# Patient Record
Sex: Male | Born: 1960 | Race: White | Hispanic: No | Marital: Single | Smoking: Former smoker
Health system: Southern US, Community
[De-identification: ages and names within clinical notes are randomized; demographics above are authoritative.]

## PROBLEM LIST (undated history)

## (undated) DIAGNOSIS — F329 Major depressive disorder, single episode, unspecified: Secondary | ICD-10-CM

## (undated) DIAGNOSIS — R51 Headache: Secondary | ICD-10-CM

## (undated) DIAGNOSIS — F419 Anxiety disorder, unspecified: Secondary | ICD-10-CM

## (undated) DIAGNOSIS — F4310 Post-traumatic stress disorder, unspecified: Secondary | ICD-10-CM

## (undated) DIAGNOSIS — F32A Depression, unspecified: Secondary | ICD-10-CM

## (undated) HISTORY — DX: Depression, unspecified: F32.A

## (undated) HISTORY — DX: Major depressive disorder, single episode, unspecified: F32.9

## (undated) HISTORY — PX: NECK SURGERY: SHX720

## (undated) HISTORY — PX: EYE SURGERY: SHX253

## (undated) HISTORY — DX: Anxiety disorder, unspecified: F41.9

## (undated) HISTORY — DX: Post-traumatic stress disorder, unspecified: F43.10

## (undated) HISTORY — DX: Headache: R51

---

## 2012-05-17 ENCOUNTER — Ambulatory Visit (INDEPENDENT_AMBULATORY_CARE_PROVIDER_SITE_OTHER): Payer: Managed Care, Other (non HMO) | Admitting: Psychiatry

## 2012-05-17 ENCOUNTER — Encounter (HOSPITAL_COMMUNITY): Payer: Self-pay | Admitting: Psychiatry

## 2012-05-17 VITALS — BP 150/90 | HR 65 | Wt 241.0 lb

## 2012-05-17 DIAGNOSIS — F3162 Bipolar disorder, current episode mixed, moderate: Secondary | ICD-10-CM

## 2012-05-17 DIAGNOSIS — F3160 Bipolar disorder, current episode mixed, unspecified: Secondary | ICD-10-CM

## 2012-05-17 DIAGNOSIS — H547 Unspecified visual loss: Secondary | ICD-10-CM | POA: Insufficient documentation

## 2012-05-17 DIAGNOSIS — Z9889 Other specified postprocedural states: Secondary | ICD-10-CM | POA: Insufficient documentation

## 2012-05-17 DIAGNOSIS — F4310 Post-traumatic stress disorder, unspecified: Secondary | ICD-10-CM
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
## 2012-06-05 ENCOUNTER — Encounter (HOSPITAL_COMMUNITY): Payer: Self-pay | Admitting: Licensed Clinical Social Worker

## 2012-06-05 ENCOUNTER — Ambulatory Visit (INDEPENDENT_AMBULATORY_CARE_PROVIDER_SITE_OTHER): Payer: Managed Care, Other (non HMO) | Admitting: Licensed Clinical Social Worker

## 2012-06-05 DIAGNOSIS — F3162 Bipolar disorder, current episode mixed, moderate: Secondary | ICD-10-CM | POA: Insufficient documentation

## 2012-06-05 DIAGNOSIS — F4310 Post-traumatic stress disorder, unspecified: Secondary | ICD-10-CM
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
## 2012-06-07 ENCOUNTER — Ambulatory Visit (INDEPENDENT_AMBULATORY_CARE_PROVIDER_SITE_OTHER): Payer: Managed Care, Other (non HMO) | Admitting: Psychiatry

## 2012-06-07 ENCOUNTER — Encounter (HOSPITAL_COMMUNITY): Payer: Self-pay | Admitting: Psychiatry

## 2012-06-07 VITALS — BP 142/93 | HR 78 | Ht 74.0 in | Wt 240.0 lb

## 2012-06-07 DIAGNOSIS — F3162 Bipolar disorder, current episode mixed, moderate: Secondary | ICD-10-CM

## 2012-06-07 DIAGNOSIS — F3160 Bipolar disorder, current episode mixed, unspecified: Secondary | ICD-10-CM

## 2012-06-07 DIAGNOSIS — F4310 Post-traumatic stress disorder, unspecified: Secondary | ICD-10-CM

## 2012-06-07 MED ORDER — ESCITALOPRAM OXALATE 20 MG PO TABS: 20.0000 mg | ORAL_TABLET | Freq: Every day | ORAL | Status: DC

## 2012-06-07 MED ORDER — BUPROPION HCL ER (XL) 300 MG PO TB24: 300.0000 mg | ORAL_TABLET | ORAL | Status: DC

## 2012-06-07 MED ORDER — TEMAZEPAM 15 MG PO CAPS: 15.0000 mg | ORAL_CAPSULE | Freq: Every evening | ORAL | Status: DC | PRN
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
## 2012-06-12 ENCOUNTER — Ambulatory Visit (INDEPENDENT_AMBULATORY_CARE_PROVIDER_SITE_OTHER): Payer: Managed Care, Other (non HMO) | Admitting: Licensed Clinical Social Worker

## 2012-06-12 DIAGNOSIS — F3162 Bipolar disorder, current episode mixed, moderate: Secondary | ICD-10-CM

## 2012-06-12 DIAGNOSIS — F4310 Post-traumatic stress disorder, unspecified: Secondary | ICD-10-CM
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
## 2012-06-19 ENCOUNTER — Ambulatory Visit (INDEPENDENT_AMBULATORY_CARE_PROVIDER_SITE_OTHER): Payer: Managed Care, Other (non HMO) | Admitting: Licensed Clinical Social Worker

## 2012-06-19 DIAGNOSIS — F3162 Bipolar disorder, current episode mixed, moderate: Secondary | ICD-10-CM

## 2012-06-19 DIAGNOSIS — F4310 Post-traumatic stress disorder, unspecified: Secondary | ICD-10-CM
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
## 2012-07-04 ENCOUNTER — Ambulatory Visit (HOSPITAL_COMMUNITY): Payer: Managed Care, Other (non HMO) | Admitting: Psychiatry

## 2012-07-10 ENCOUNTER — Other Ambulatory Visit (HOSPITAL_COMMUNITY): Payer: Self-pay | Admitting: *Deleted

## 2012-07-10 ENCOUNTER — Ambulatory Visit (INDEPENDENT_AMBULATORY_CARE_PROVIDER_SITE_OTHER): Payer: Managed Care, Other (non HMO) | Admitting: Licensed Clinical Social Worker

## 2012-07-10 DIAGNOSIS — F4310 Post-traumatic stress disorder, unspecified: Secondary | ICD-10-CM

## 2012-07-10 DIAGNOSIS — F3162 Bipolar disorder, current episode mixed, moderate: Secondary | ICD-10-CM

## 2012-07-10 MED ORDER — BUPROPION HCL ER (XL) 300 MG PO TB24: 300.0000 mg | ORAL_TABLET | ORAL | Status: DC
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
## 2012-07-10 NOTE — Telephone Encounter (Signed)
RX for Wellbutrin printed instead of being sent electronically. Resent at this time

## 2012-07-12 ENCOUNTER — Ambulatory Visit (HOSPITAL_COMMUNITY): Payer: Managed Care, Other (non HMO) | Admitting: Psychiatry

## 2012-07-24 ENCOUNTER — Ambulatory Visit (INDEPENDENT_AMBULATORY_CARE_PROVIDER_SITE_OTHER): Payer: Managed Care, Other (non HMO) | Admitting: Licensed Clinical Social Worker

## 2012-07-24 DIAGNOSIS — F3162 Bipolar disorder, current episode mixed, moderate: Secondary | ICD-10-CM

## 2012-07-24 DIAGNOSIS — F4310 Post-traumatic stress disorder, unspecified: Secondary | ICD-10-CM
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
## 2012-07-31 ENCOUNTER — Ambulatory Visit (INDEPENDENT_AMBULATORY_CARE_PROVIDER_SITE_OTHER): Payer: Managed Care, Other (non HMO) | Admitting: Licensed Clinical Social Worker

## 2012-07-31 DIAGNOSIS — F4310 Post-traumatic stress disorder, unspecified: Secondary | ICD-10-CM

## 2012-07-31 DIAGNOSIS — F3162 Bipolar disorder, current episode mixed, moderate: Secondary | ICD-10-CM

## 2012-07-31 NOTE — Progress Notes (Signed)
   THERAPIST PROGRESS NOTE  Session Time: 11:30-12:20pm  Participation Level: Active  Behavioral Response: Well GroomedAlertAnxious  Type of Therapy: Individual Therapy  Treatment Goals addressed: Coping  Interventions: CBT, Strength-based, Supportive and Reframing  Summary: Jim Owens is a 52 y.o. male who presents with euthymic mood and anxious affect. He reports doing well, but did feel triggered yesterday after deciding to return his Jeep because he can no longer afford it. He processes his feelings of grief and the time it has taken to reconcile and accept that his life has taken on a different plan than expected. He wants to be financially responsible and feels good about his decision. He writes in his journal often and finds this very therapeutic. His sleep and appetite are wnl.   Suicidal/Homicidal: Nowithout intent/plan  Therapist Response: Assessed patients current functioning and reviewed progress. Reviewed coping strategies. Assessed patients safety and assisted in identifying protective factors.  Reviewed crisis plan with patient. Assisted patient with the expression of anxiety. Reviewed patients self care plan. Assessed progress related to self care. Patients self care is good. Recommend daily exercise, increased socialization and recreation. Used CBT to assist patient with the identification of negative distortions and irrational thoughts. Encouraged patient to verbalize alternative and factual responses which challenge thought distortions. Used DBT to practice mindfulness, review distraction list and improve distress tolerance skills. Processed and normalized patients grief reaction.   Plan: Return again in two weeks.  Diagnosis: Axis I: Post Traumatic Stress Disorder and bi polar    Axis II: No diagnosis    Lindwood Mogel, LCSW 07/31/2012

## 2012-08-06 ENCOUNTER — Other Ambulatory Visit (HOSPITAL_COMMUNITY): Payer: Self-pay | Admitting: *Deleted

## 2012-08-06 DIAGNOSIS — F3162 Bipolar disorder, current episode mixed, moderate: Secondary | ICD-10-CM

## 2012-08-06 DIAGNOSIS — F4310 Post-traumatic stress disorder, unspecified: Secondary | ICD-10-CM

## 2012-08-06 NOTE — Telephone Encounter (Signed)
Pt changing to different provider in clinic.Appt with Jorje Guild, PA on 7/24.

## 2012-08-07 ENCOUNTER — Ambulatory Visit (INDEPENDENT_AMBULATORY_CARE_PROVIDER_SITE_OTHER): Payer: Managed Care, Other (non HMO) | Admitting: Licensed Clinical Social Worker

## 2012-08-07 DIAGNOSIS — F4310 Post-traumatic stress disorder, unspecified: Secondary | ICD-10-CM

## 2012-08-07 DIAGNOSIS — F3162 Bipolar disorder, current episode mixed, moderate: Secondary | ICD-10-CM

## 2012-08-07 MED ORDER — LITHIUM CARBONATE 300 MG PO CAPS
300.0000 mg | ORAL_CAPSULE | Freq: Two times a day (BID) | ORAL | Status: DC
Start: 2012-08-06 — End: 2012-10-04

## 2012-08-07 MED ORDER — TEMAZEPAM 15 MG PO CAPS: 15.0000 mg | ORAL_CAPSULE | Freq: Every evening | ORAL | Status: DC | PRN

## 2012-08-07 NOTE — Telephone Encounter (Signed)
Refills given as prescribed by Dr. Lolly Mustache 06/07/12 as he is current provider until pt sees Jorje Guild, [place]

## 2012-08-07 NOTE — Progress Notes (Signed)
   THERAPIST PROGRESS NOTE  Session Time: 11:30am-12:20pm  Participation Level: Active  Behavioral Response: Well GroomedAlertAnxious  Type of Therapy: Individual Therapy  Treatment Goals addressed: Coping  Interventions: CBT, DBT, Strength-based, Supportive and Reframing  Summary: Jim Owens is a 53 y.o. male who presents with anxious mood and bright affect. He reports having a good week, without any triggering events related to his trauma. He reports his realization that he has difficulty interpreting the emotions of others. He experiences a disconnect and is frustrated by this. When he looks at a picture of a child who is smiling, he does not automatically realize that the child is happy. He explores his PTSD symptoms and anger towards middle easterners. He is ashamed he feels this way and needs reassurance. He remains active and social. His sleep and appetite are wnl.    Suicidal/Homicidal: Nowithout intent/plan  Therapist Response: Assessed patients current functioning and reviewed progress. Reviewed coping strategies. Assessed patients safety and assisted in identifying protective factors.  Reviewed crisis plan with patient. Assisted patient with the expression of anxiety. Reviewed patients self care plan. Assessed progress related to self care. Patients self care is good. Recommend daily exercise, increased socialization and recreation. Used CBT to assist patient with the identification of negative distortions and irrational thoughts. Encouraged patient to verbalize alternative and factual responses which challenge thought distortions. Used DBT to practice mindfulness, review distraction list and improve distress tolerance skills.   Plan: Return again in one weeks.  Diagnosis: Axis I: Post Traumatic Stress Disorder and bi polar     Axis II: No diagnosis    Maynard David, LCSW 08/07/2012

## 2012-08-09 ENCOUNTER — Ambulatory Visit (HOSPITAL_COMMUNITY): Payer: Managed Care, Other (non HMO) | Admitting: Psychiatry

## 2012-08-09 ENCOUNTER — Ambulatory Visit (INDEPENDENT_AMBULATORY_CARE_PROVIDER_SITE_OTHER): Payer: Managed Care, Other (non HMO) | Admitting: Physician Assistant

## 2012-08-09 ENCOUNTER — Encounter (HOSPITAL_COMMUNITY): Payer: Self-pay | Admitting: Physician Assistant

## 2012-08-09 VITALS — BP 129/90 | HR 64 | Ht 74.0 in | Wt 242.2 lb

## 2012-08-09 DIAGNOSIS — F3162 Bipolar disorder, current episode mixed, moderate: Secondary | ICD-10-CM

## 2012-08-09 DIAGNOSIS — F4310 Post-traumatic stress disorder, unspecified: Secondary | ICD-10-CM

## 2012-08-09 DIAGNOSIS — F319 Bipolar disorder, unspecified: Secondary | ICD-10-CM

## 2012-08-09 MED ORDER — ESCITALOPRAM OXALATE 20 MG PO TABS: 20.0000 mg | ORAL_TABLET | Freq: Every day | ORAL | Status: DC

## 2012-08-09 MED ORDER — BUPROPION HCL ER (XL) 300 MG PO TB24: 300.0000 mg | ORAL_TABLET | ORAL | Status: DC

## 2012-08-14 ENCOUNTER — Other Ambulatory Visit (HOSPITAL_COMMUNITY): Payer: Self-pay | Admitting: Physician Assistant

## 2012-08-14 ENCOUNTER — Ambulatory Visit (INDEPENDENT_AMBULATORY_CARE_PROVIDER_SITE_OTHER): Payer: Managed Care, Other (non HMO) | Admitting: Licensed Clinical Social Worker

## 2012-08-14 DIAGNOSIS — F3162 Bipolar disorder, current episode mixed, moderate: Secondary | ICD-10-CM

## 2012-08-14 DIAGNOSIS — F4310 Post-traumatic stress disorder, unspecified: Secondary | ICD-10-CM

## 2012-08-14 MED ORDER — ESCITALOPRAM OXALATE 20 MG PO TABS: 20.0000 mg | ORAL_TABLET | Freq: Every day | ORAL | Status: DC

## 2012-08-14 NOTE — Progress Notes (Signed)
   THERAPIST PROGRESS NOTE  Session Time: 11:30am-12:20pm  Participation Level: Active  Behavioral Response: Well GroomedAlertAnxious and Euthymic  Type of Therapy: Individual Therapy  Treatment Goals addressed: Coping  Interventions: CBT, DBT, Strength-based, Supportive and Reframing  Summary: Jim Owens is a 52 y.o. male who presents with euthymic mood and anxious affect. He reports feeling tired because he is not sleeping well due to nightmares. He describes his dreams and his confusion over them. He feels numb and disconnected from his feelings. He believes that he has experienced too much trauma in a short period of time and that he has shut off his ability to feel. He is fearful of exploring his trauma in emotional detail. He reports that he would have to be in the hospital to do this and even then, is uncertain if he would make it out. He is able to discuss facts but not feelings. He processes his grief over the loss of his job and his desire to spend time with children again and help them. His sleep and appetite are wnl.    Suicidal/Homicidal: Nowithout intent/plan  Therapist Response: Assessed patients current functioning and reviewed progress. Reviewed coping strategies. Assessed patients safety and assisted in identifying protective factors.  Reviewed crisis plan with patient. Assisted patient with the expression of anxiety. Reviewed patients self care plan. Assessed progress related to self care. Patients self care is excellenlt. Recommend daily exercise, increased socialization and recreation. Used DBT to practice mindfulness, review distraction list and improve distress tolerance skills. Used CBT to assist patient with the identification of negative distortions and irrational thoughts. Encouraged patient to verbalize alternative and factual responses which challenge thought distortions. Processed and normalized patients grief reaction.   Plan: Return again in one  weeks.  Diagnosis: Axis I: Post Traumatic Stress Disorder and bi polar    Axis II: No diagnosis    Marcena Dias, LCSW 08/14/2012
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
## 2012-08-20 ENCOUNTER — Ambulatory Visit (INDEPENDENT_AMBULATORY_CARE_PROVIDER_SITE_OTHER): Payer: Managed Care, Other (non HMO) | Admitting: Licensed Clinical Social Worker

## 2012-08-20 DIAGNOSIS — F4310 Post-traumatic stress disorder, unspecified: Secondary | ICD-10-CM

## 2012-08-20 DIAGNOSIS — F3162 Bipolar disorder, current episode mixed, moderate: Secondary | ICD-10-CM

## 2012-08-20 NOTE — Progress Notes (Signed)
   THERAPIST PROGRESS NOTE  Session Time: 10:30am-11:20am  Participation Level: Active  Behavioral Response: Well GroomedAlertEuthymic  Type of Therapy: Individual Therapy  Treatment Goals addressed: Coping  Interventions: CBT, DBT, Strength-based, Supportive and Reframing  Summary: Jim Owens is a 52 y.o. male who presents with euthymic mood and bright affect. He reports feeling well and that he continues to focus on identifying his emotions. Today is the first time he has realized that he is "hurt" over loosing his job. He is able to identify easily with the feelings of anger, but being hurt and betrayed are difficult for him. He continues to process his grief and disbelief over how he left his job. He processes his concern over his black out and his fear that if he begins to do trauma work, that he will uncover something he is not prepared for. His sleep and appetite are wnl.    Suicidal/Homicidal: Nowithout intent/plan  Therapist Response: Assessed patients current functioning and reviewed progress. Reviewed coping strategies. Assessed patients safety and assisted in identifying protective factors.  Reviewed crisis plan with patient. Assisted patient with the expression of frustration. Reviewed patients self care plan. Assessed progress related to self care. Patients self care is goode. Recommend daily exercise, increased socialization and recreation. Used CBT to assist patient with the identification of negative distortions and irrational thoughts. Encouraged patient to verbalize alternative and factual responses which challenge thought distortions. Used DBT to practice mindfulness, review distraction list and improve distress tolerance skills. Processed and normalized patients grief reaction.   Plan: Return again in one weeks.  Diagnosis: Axis I: Post Traumatic Stress Disorder and bi polar    Axis II: No diagnosis    Magen Suriano, LCSW 08/20/2012

## 2012-08-30 ENCOUNTER — Other Ambulatory Visit (HOSPITAL_COMMUNITY): Payer: Self-pay | Admitting: *Deleted

## 2012-08-30 DIAGNOSIS — F4310 Post-traumatic stress disorder, unspecified: Secondary | ICD-10-CM

## 2012-08-30 DIAGNOSIS — F3162 Bipolar disorder, current episode mixed, moderate: Secondary | ICD-10-CM

## 2012-08-30 NOTE — Telephone Encounter (Signed)
Pharmacy changed in [facility]

## 2012-08-31 ENCOUNTER — Telehealth (HOSPITAL_COMMUNITY): Payer: Self-pay

## 2012-08-31 MED ORDER — TEMAZEPAM 15 MG PO CAPS: 15.0000 mg | ORAL_CAPSULE | Freq: Every evening | ORAL | Status: DC | PRN

## 2012-08-31 NOTE — Telephone Encounter (Signed)
08/31/12 10:16AM  Patient called requesting to speak with Sandi (nurse) patient stated that the pharmacy they were speaking about is correct [facility]'s [facility]..Jim Owens

## 2012-09-05 ENCOUNTER — Ambulatory Visit (INDEPENDENT_AMBULATORY_CARE_PROVIDER_SITE_OTHER): Payer: Managed Care, Other (non HMO) | Admitting: Licensed Clinical Social Worker

## 2012-09-05 DIAGNOSIS — F4310 Post-traumatic stress disorder, unspecified: Secondary | ICD-10-CM

## 2012-09-05 DIAGNOSIS — F3162 Bipolar disorder, current episode mixed, moderate: Secondary | ICD-10-CM

## 2012-09-05 NOTE — Progress Notes (Signed)
   THERAPIST PROGRESS NOTE  Session Time: 9:30am-10:20am  Participation Level: Active  Behavioral Response: Well GroomedAlertAnxious  Type of Therapy: Individual Therapy  Treatment Goals addressed: Coping  Interventions: CBT, DBT, Strength-based, Supportive and Reframing  Summary: Jim Owens is a 52 y.o. male who presents with euthymic mood and anxious affect. He has brought his journal with him today and wants to discuss patterns he notices around decision making, anxiety related to purchasing items and self doubt related to how his job ended. 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 He may look into pursuing this in a more formal way in the future. His anxiety remains high, but he is able to use CBT and DBT to help reframe and redirect his thinking when needed. His sleep and appetite are wnl.   Suicidal/Homicidal: Nowithout intent/plan  Therapist Response: Assessed patients current functioning and reviewed progress. Reviewed coping strategies. Assessed patients safety and assisted in identifying protective factors.  Reviewed crisis plan with patient. Assisted patient with the expression of anxeity. Reviewed patients self care plan. Assessed progress related to self care. Patients self care is good. Recommend daily exercise, increased socialization and recreation. Used CBT to assist patient with the identification of negative distortions and irrational thoughts. Encouraged patient to verbalize alternative and factual responses which challenge thought distortions. Used DBT to practice mindfulness, review distraction list and improve distress tolerance skills. Processed and normalized patients grief reaction.   Plan: Return again in two weeks.  Diagnosis: Axis I: Post  Traumatic Stress Disorder and bi polar    Axis II: No diagnosis    Katheren Jimmerson, LCSW 09/05/2012

## 2012-09-06 ENCOUNTER — Other Ambulatory Visit (HOSPITAL_COMMUNITY): Payer: Self-pay | Admitting: *Deleted

## 2012-09-06 DIAGNOSIS — F3162 Bipolar disorder, current episode mixed, moderate: Secondary | ICD-10-CM

## 2012-09-06 DIAGNOSIS — F4310 Post-traumatic stress disorder, unspecified: Secondary | ICD-10-CM

## 2012-09-06 MED ORDER — TEMAZEPAM 15 MG PO CAPS: 15.0000 mg | ORAL_CAPSULE | Freq: Every evening | ORAL | Status: DC | PRN

## 2012-09-06 NOTE — Telephone Encounter (Signed)
Med not received by [facility] on 08/30/12. Called to [place] at [facility] at this time

## 2012-09-10 ENCOUNTER — Ambulatory Visit (HOSPITAL_COMMUNITY): Payer: Managed Care, Other (non HMO) | Admitting: Licensed Clinical Social Worker

## 2012-09-24 ENCOUNTER — Ambulatory Visit (INDEPENDENT_AMBULATORY_CARE_PROVIDER_SITE_OTHER): Payer: Managed Care, Other (non HMO) | Admitting: Licensed Clinical Social Worker

## 2012-09-24 DIAGNOSIS — F4310 Post-traumatic stress disorder, unspecified: Secondary | ICD-10-CM

## 2012-09-24 DIAGNOSIS — F3162 Bipolar disorder, current episode mixed, moderate: Secondary | ICD-10-CM
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
## 2012-09-25 ENCOUNTER — Ambulatory Visit (INDEPENDENT_AMBULATORY_CARE_PROVIDER_SITE_OTHER): Payer: Managed Care, Other (non HMO) | Admitting: Physician Assistant

## 2012-09-25 ENCOUNTER — Encounter (HOSPITAL_COMMUNITY): Payer: Self-pay | Admitting: Physician Assistant

## 2012-09-25 VITALS — BP 137/94 | HR 69 | Ht 74.0 in | Wt 240.4 lb

## 2012-09-25 DIAGNOSIS — F319 Bipolar disorder, unspecified: Secondary | ICD-10-CM

## 2012-09-25 DIAGNOSIS — F4310 Post-traumatic stress disorder, unspecified: Secondary | ICD-10-CM

## 2012-09-25 DIAGNOSIS — F3162 Bipolar disorder, current episode mixed, moderate: Secondary | ICD-10-CM

## 2012-09-25 MED ORDER — SERTRALINE HCL 100 MG PO TABS: ORAL_TABLET | ORAL | Status: DC

## 2012-09-25 NOTE — Progress Notes (Signed)
[facility] [id number] Progress Note  Jim Owens [id number] 52 y.o.  09/25/2012 1:45 PM  Chief Complaint: Anxiety  History of Present Illness: [name] presents today to followup on his treatment for PTSD and bipolar. He reports that his mood has been fairly stable, although he describes some mild lability. He expresses that he is been having a significant amount of anxiety in that he worries excessively and occasionally feels panicky. He also finds himself obsessing over finding the best price for items while shopping online. He endorses some suicidal thoughts that her passive in nature, but denies any plan or intent. He denies any auditory or visual hallucinations or homicidal ideation. He is come to realize that he is a valuable person, and does not need validation from others. He feels that the work he is doing in therapy has been very helpful.  Suicidal Ideation: Yes Plan Formed: No Patient has means to carry out plan: No  Homicidal Ideation: No Plan Formed: No Patient has means to carry out plan: No  Review of Systems: Psychiatric: Agitation: No Hallucination: No Depressed Mood: No Insomnia: No Hypersomnia: No Altered Concentration: No Feels Worthless: No Grandiose Ideas: No Belief In Special Powers: No New/Increased Substance Abuse: No Compulsions: No  Neurologic: Headache: No Seizure: No Paresthesias: No  Medical history.  Patient has history of neck surgery and multiple eye surgery. He still has been impairment on his right eye. He was diagnosed with retinal detachment. Patient had a history of seizures, blackouts, traumatic brain injury or any loss of consciousness.   Psychosocial history.  Patient was born and raised in [place]. He lived in [place] and [place] area for 23 years. He is a Chartered loss adjuster and recently retired. He moved to [place] upon his retirement. He likes the area. He has good friends who lives in [place]. Patient's mother  sister and brother lives in [place]. He has a step brother and sister who lives in [place]. Patient never married. He has no children.   Family history.  Patient endorsed grandfather and uncle has bipolar disorder. Mother has depression and alcoholism.    Outpatient Encounter Prescriptions as of 09/25/2012  Medication Sig Dispense Refill  . buPROPion (WELLBUTRIN XL) 300 MG 24 hr tablet Take 1 tablet (300 mg total) by mouth every morning.  30 tablet  2  . lithium carbonate 300 MG capsule Take 1 capsule (300 mg total) by mouth 2 (two) times daily with a meal.  60 capsule  0  . nepafenac (NEVANAC) 0.1 % ophthalmic suspension 3 drops 3 (three) times daily.      . sertraline (ZOLOFT) 100 MG tablet Take 1/2 tablet daily for 6 days, then take one daily  30 tablet  1  . temazepam (RESTORIL) 15 MG capsule Take 1 capsule (15 mg total) by mouth at bedtime as needed for sleep.  30 capsule  0  . [DISCONTINUED] escitalopram (LEXAPRO) 20 MG tablet Take 1 tablet (20 mg total) by mouth daily.  30 tablet  2   No facility-administered encounter medications on file as of 09/25/2012.    Past Psychiatric History/Hospitalization(s): Anxiety: Yes Bipolar Disorder: Yes Depression: Yes Mania: Yes Psychosis: No Schizophrenia: No Personality Disorder: No Hospitalization for psychiatric illness: Yes History of Electroconvulsive Shock Therapy: No Prior Suicide Attempts: No  Physical Exam: Constitutional:  BP 137/94  Pulse 69  Ht 6\' 2"  (1.88 m)  Wt 240 lb 6.4 oz (109.045 kg)  BMI 30.85 kg/m2  General Appearance: alert, oriented, no acute distress, well nourished and  well groomed and casually dressed  Musculoskeletal: Strength & Muscle Tone: within normal limits Gait & Station: normal Patient leans: N/A  Psychiatric: Speech (describe rate, volume, coherence, spontaneity, and abnormalities if any): Clear and coherent at a regular rate and rhythm  Thought Process (describe rate, content, abstract  reasoning, and computation): Within normal limits  Associations: Intact  Thoughts: obsessions  Mental Status: Orientation: oriented to person, place, time/date and situation Mood & Affect: anxiety Attention Span & Concentration: Intact  Medical Decision Making (Choose Three): Established Problem, Stable/Improving (1), Review of Psycho-Social Stressors (1) and Review of New Medication or Change in Dosage (2) PTSD, but Assessment: Axis I: PTSD, bipolar disorder  Axis II: Deferred  Axis III: Noncontributory  Axis IV: Moderate  Axis V: 60   Plan: We will cross taper his Lexapro to Zoloft 100 mg daily, and continue his Wellbutrin XL 300 mg daily, lithium carbonate 300 mg twice daily with meals, and temazepam 15 mg at bedtime. He will return for followup at my next available appointment, hopefully in 4 weeks.  Demitrius Crass, PA-C 09/25/2012

## 2012-10-03 ENCOUNTER — Other Ambulatory Visit (HOSPITAL_COMMUNITY): Payer: Self-pay | Admitting: Psychiatry

## 2012-10-04 ENCOUNTER — Other Ambulatory Visit (HOSPITAL_COMMUNITY): Payer: Self-pay | Admitting: Psychiatry

## 2012-10-04 ENCOUNTER — Other Ambulatory Visit (HOSPITAL_COMMUNITY): Payer: Self-pay | Admitting: Physician Assistant

## 2012-10-04 DIAGNOSIS — F3162 Bipolar disorder, current episode mixed, moderate: Secondary | ICD-10-CM

## 2012-10-04 DIAGNOSIS — F4310 Post-traumatic stress disorder, unspecified: Secondary | ICD-10-CM

## 2012-10-08 ENCOUNTER — Ambulatory Visit (INDEPENDENT_AMBULATORY_CARE_PROVIDER_SITE_OTHER): Payer: Managed Care, Other (non HMO) | Admitting: Licensed Clinical Social Worker

## 2012-10-08 DIAGNOSIS — F4310 Post-traumatic stress disorder, unspecified: Secondary | ICD-10-CM

## 2012-10-08 DIAGNOSIS — F3162 Bipolar disorder, current episode mixed, moderate: Secondary | ICD-10-CM

## 2012-10-08 NOTE — Progress Notes (Signed)
   THERAPIST PROGRESS NOTE  Session Time: 10:30am-11:20am  Participation Level: Active  Behavioral Response: Well GroomedAlertAnxious and Euthymic  Type of Therapy: Individual Therapy  Treatment Goals addressed: Coping  Interventions: CBT, Strength-based, Supportive and Reframing  Summary: Jim Owens is a 52 y.o. male who presents with euthymic mood and anxious affect. He reports ongoing improvement in his degree of anxiety and frustration. He has finally moved into his home. He is happy there, enjoys the peace and solitude. He needed to have some space for himself and to be alone. He continues to write to express his feelings and processes his anger over how much he has been betrayed by those who he worked for and with. He processes his grief over this. He is learning his early warning signs for PTSD triggers. His sleep and appetite are wnl.   Suicidal/Homicidal: Nowithout intent/plan  Therapist Response: Assessed patients current functioning and reviewed progress. Reviewed coping strategies. Assessed patients safety and assisted in identifying protective factors.  Reviewed crisis plan with patient. Assisted patient with the expression of frustration. Reviewed patients self care plan. Assessed progress related to self care. Patients self care is good. Recommend daily exercise, increased socialization and recreation. Used CBT to assist patient with the identification of negative distortions and irrational thoughts. Encouraged patient to verbalize alternative and factual responses which challenge thought distortions. Processed and normalized patients grief reaction.   Plan: Return again in two weeks.  Diagnosis: Axis I: Bipolar, mixed and Post Traumatic Stress Disorder    Axis II: No diagnosis    Rosselyn Martha, LCSW 10/08/2012

## 2012-10-22 ENCOUNTER — Ambulatory Visit (INDEPENDENT_AMBULATORY_CARE_PROVIDER_SITE_OTHER): Payer: Managed Care, Other (non HMO) | Admitting: Licensed Clinical Social Worker

## 2012-10-22 DIAGNOSIS — F4310 Post-traumatic stress disorder, unspecified: Secondary | ICD-10-CM

## 2012-10-22 DIAGNOSIS — F3162 Bipolar disorder, current episode mixed, moderate: Secondary | ICD-10-CM
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
## 2012-10-30 ENCOUNTER — Other Ambulatory Visit (HOSPITAL_COMMUNITY): Payer: Self-pay | Admitting: Physician Assistant

## 2012-11-05 ENCOUNTER — Ambulatory Visit (INDEPENDENT_AMBULATORY_CARE_PROVIDER_SITE_OTHER): Payer: Managed Care, Other (non HMO) | Admitting: Physician Assistant

## 2012-11-05 ENCOUNTER — Encounter (INDEPENDENT_AMBULATORY_CARE_PROVIDER_SITE_OTHER): Payer: Self-pay

## 2012-11-05 VITALS — BP 134/85 | HR 78 | Ht 74.0 in | Wt 233.0 lb

## 2012-11-05 DIAGNOSIS — F4310 Post-traumatic stress disorder, unspecified: Secondary | ICD-10-CM

## 2012-11-05 DIAGNOSIS — F319 Bipolar disorder, unspecified: Secondary | ICD-10-CM

## 2012-11-05 DIAGNOSIS — F3162 Bipolar disorder, current episode mixed, moderate: Secondary | ICD-10-CM

## 2012-11-07 ENCOUNTER — Ambulatory Visit (INDEPENDENT_AMBULATORY_CARE_PROVIDER_SITE_OTHER): Payer: Managed Care, Other (non HMO) | Admitting: Licensed Clinical Social Worker

## 2012-11-07 DIAGNOSIS — F3162 Bipolar disorder, current episode mixed, moderate: Secondary | ICD-10-CM

## 2012-11-07 DIAGNOSIS — F4310 Post-traumatic stress disorder, unspecified: Secondary | ICD-10-CM

## 2012-11-07 NOTE — Progress Notes (Signed)
   THERAPIST PROGRESS NOTE  Session Time: 10:30am-11:20am  Participation Level: Active  Behavioral Response: Well GroomedAlertEuthymic  Type of Therapy: Individual Therapy  Treatment Goals addressed: Coping  Interventions: CBT, Strength-based, Supportive and Reframing  Summary: Jim Owens is a 52 y.o. male who presents with euthymic mood and bright affect. He reports that he is doing well with well controlled mood stability, well controlled PTSD and anxiety. He continues to write as his primary tool for expression of his feelings. He reports that he has been learning more about himself and has come to the realization that the people whom he used to work for didn't care about him and he believes that they would not have cared if he killed himself. He struggles with a lack of "feeling" and understands that this is related to his trauma. He also struggles with hyper vigilance and assumptions of dread. His sleep and appetite are wnl.   Suicidal/Homicidal: Nowithout intent/plan  Therapist Response: Assessed patients current functioning and reviewed progress. Reviewed coping strategies. Assessed patients safety and assisted in identifying protective factors.  Reviewed crisis plan with patient. Assisted patient with the expression of anxiety related to his trauma history. Reviewed patients self care plan. Assessed progress related to self care. Patients self care is excellent . Recommend daily exercise, increased socialization and recreation. Used CBT to assist patient with the identification of negative distortions and irrational thoughts. Encouraged patient to verbalize alternative and factual responses which challenge thought distortions. Used DBT to practice mindfulness, review distraction list and improve distress tolerance skills.   Plan: Return again in two weeks.  Diagnosis: Axis I: Post Traumatic Stress Disorder and bi polar    Axis II: No diagnosis    Teea Ducey,  LCSW 11/07/2012

## 2012-11-09 ENCOUNTER — Encounter (HOSPITAL_COMMUNITY): Payer: Self-pay | Admitting: Physician Assistant

## 2012-11-09 NOTE — Progress Notes (Signed)
   [facility] Follow-up Outpatient Visit  Jim Owens Oct 29, 1960  Date: 11/05/2012   Subjective: [name] presents today to followup on his treatment for PTSD and bipolar disorder. At his last visit we cross taper his Celexa to Zoloft because he was experiencing an increase in anxiety. He reports that the cross taper went well. He is feeling less obsessive and experiencing less anxiety. His mood is "pretty good." He reports that he is sleeping well, and his appetite is good. He denies any suicidal or homicidal ideation. He denies any auditory or visual hallucinations. He has recently discovered a new hobby and has begun practicing with a cross bow. He is also enjoying being outdoors more.  Filed Vitals:   11/05/12 1608  BP: 134/85  Pulse: 78    Mental Status Examination  Appearance: Well groomed and casually dressed Alert: Yes Attention: good  Cooperative: Yes Eye Contact: Good Speech: Clear and coherent Psychomotor Activity: Normal Memory/Concentration: Intact Oriented: person, place, time/date and situation Mood: Euthymic Affect: Congruent Thought Processes and Associations: Linear Fund of Knowledge: Good Thought Content: Normal Insight: Good Judgement: Good  Diagnosis: PTSD, bipolar 1  Treatment Plan: We will continue his Zoloft at 100 mg daily, Wellbutrin XL 300 mg daily, lithium carbonate 300 mg twice daily with meals, and Restoril 15 mg at bedtime. He will return for a followup visit in 2-3 months with Dr. Donell Beers.  Charene Mccallister, PA-C

## 2012-11-17 ENCOUNTER — Other Ambulatory Visit (HOSPITAL_COMMUNITY): Payer: Self-pay | Admitting: Physician Assistant

## 2012-11-17 DIAGNOSIS — F4310 Post-traumatic stress disorder, unspecified: Secondary | ICD-10-CM

## 2012-11-17 DIAGNOSIS — F3162 Bipolar disorder, current episode mixed, moderate: Secondary | ICD-10-CM

## 2012-11-19 ENCOUNTER — Ambulatory Visit (INDEPENDENT_AMBULATORY_CARE_PROVIDER_SITE_OTHER): Payer: Managed Care, Other (non HMO) | Admitting: Licensed Clinical Social Worker

## 2012-11-19 DIAGNOSIS — F3162 Bipolar disorder, current episode mixed, moderate: Secondary | ICD-10-CM

## 2012-11-19 DIAGNOSIS — F4310 Post-traumatic stress disorder, unspecified: Secondary | ICD-10-CM
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
## 2012-11-22 ENCOUNTER — Other Ambulatory Visit: Payer: Self-pay

## 2012-11-27 ENCOUNTER — Other Ambulatory Visit (HOSPITAL_COMMUNITY): Payer: Self-pay | Admitting: Physician Assistant

## 2012-11-27 DIAGNOSIS — F4310 Post-traumatic stress disorder, unspecified: Secondary | ICD-10-CM

## 2012-11-29 NOTE — Telephone Encounter (Signed)
Chart reviewed. Refill appropriate. 

## 2012-12-01 ENCOUNTER — Other Ambulatory Visit (HOSPITAL_COMMUNITY): Payer: Self-pay | Admitting: Physician Assistant

## 2012-12-01 DIAGNOSIS — F3162 Bipolar disorder, current episode mixed, moderate: Secondary | ICD-10-CM

## 2012-12-03 NOTE — Telephone Encounter (Signed)
Appt with Dr. Donell Beers 01/04/13 Chart reviewed Refill appropriate

## 2012-12-04 ENCOUNTER — Other Ambulatory Visit (HOSPITAL_COMMUNITY): Payer: Self-pay | Admitting: Physician Assistant

## 2012-12-10 ENCOUNTER — Ambulatory Visit (HOSPITAL_COMMUNITY): Payer: Managed Care, Other (non HMO) | Admitting: Licensed Clinical Social Worker

## 2012-12-17 ENCOUNTER — Other Ambulatory Visit (HOSPITAL_COMMUNITY): Payer: Self-pay | Admitting: Physician Assistant

## 2012-12-17 DIAGNOSIS — F4310 Post-traumatic stress disorder, unspecified: Secondary | ICD-10-CM

## 2012-12-22 ENCOUNTER — Other Ambulatory Visit (HOSPITAL_COMMUNITY): Payer: Self-pay | Admitting: Physician Assistant

## 2012-12-22 ENCOUNTER — Other Ambulatory Visit (HOSPITAL_COMMUNITY): Payer: Self-pay | Admitting: Psychiatry

## 2012-12-22 DIAGNOSIS — F4310 Post-traumatic stress disorder, unspecified: Secondary | ICD-10-CM

## 2012-12-24 ENCOUNTER — Ambulatory Visit (INDEPENDENT_AMBULATORY_CARE_PROVIDER_SITE_OTHER): Payer: Managed Care, Other (non HMO) | Admitting: Licensed Clinical Social Worker

## 2012-12-24 ENCOUNTER — Other Ambulatory Visit (HOSPITAL_COMMUNITY): Payer: Self-pay | Admitting: Physician Assistant

## 2012-12-24 DIAGNOSIS — F4310 Post-traumatic stress disorder, unspecified: Secondary | ICD-10-CM

## 2012-12-24 DIAGNOSIS — F3162 Bipolar disorder, current episode mixed, moderate: Secondary | ICD-10-CM

## 2012-12-24 NOTE — Progress Notes (Addendum)
   THERAPIST PROGRESS NOTE  Session Time: 9:30am-10:20am  Participation Level: Active  Behavioral Response: Well GroomedAlertAnxious and Depressed  Type of Therapy: Individual Therapy  Treatment Goals addressed: Coping  Interventions: CBT, DBT, Strength-based, Supportive and Reframing  Summary: Jim Owens is a 51 y.o. male who presents with slightly depressed mood and flat affect. He reports feeling down lately with occasional anxiety. He is frustrated by his mood lability but has been focusing on maintaining a schedule with self discipline around sleeping, eating and exercise. He continues to endorse some PTSD symptoms, particularly trouble with a high startle response. His sleep and appetite are wnl.   Suicidal/Homicidal: Nowithout intent/plan  Therapist Response: Assessed patients current functioning and reviewed progress. Reviewed coping strategies. Assessed patients safety and assisted in identifying protective factors.  Reviewed crisis plan with patient. Assisted patient with the expression of frustration. Reviewed patients self care plan. Assessed progress related to self care. Patients self care is very good . Recommend daily exercise, increased socialization and recreation. Used CBT to assist patient with the identification of negative distortions and irrational thoughts. Encouraged patient to verbalize alternative and factual responses which challenge thought distortions. Used DBT to practice mindfulness, review distraction list and improve distress tolerance skills. Used motivational interviewing to assist and encourage patient through the change process. Explored patients barriers to change.   Plan: Return again in two to three weeks.  Diagnosis: Axis I: Bipolar, Depressed and Post Traumatic Stress Disorder    Axis II: No diagnosis    Toshio Slusher, LCSW 12/24/2012 Session time is incorrect. Correct time is 10:30 -11:20

## 2012-12-25 MED ORDER — TEMAZEPAM 15 MG PO CAPS: ORAL_CAPSULE | ORAL | Status: DC

## 2012-12-27 ENCOUNTER — Other Ambulatory Visit (HOSPITAL_COMMUNITY): Payer: Self-pay | Admitting: Physician Assistant

## 2012-12-29 ENCOUNTER — Other Ambulatory Visit (HOSPITAL_COMMUNITY): Payer: Self-pay | Admitting: Psychiatry

## 2012-12-31 NOTE — Telephone Encounter (Signed)
Chart reviewed, refill not appropriate at this time. Refill requested too soon. Filled 12/25/12, has appointment 12/19.

## 2012-12-31 NOTE — Telephone Encounter (Signed)
Chart reviewed, refill not appropriate. Has 1 refill on file with next appointment 01/04/13. Request denied at this time

## 2013-01-04 ENCOUNTER — Ambulatory Visit (INDEPENDENT_AMBULATORY_CARE_PROVIDER_SITE_OTHER): Payer: Managed Care, Other (non HMO) | Admitting: Psychiatry

## 2013-01-04 VITALS — BP 132/80 | HR 64 | Ht 74.0 in | Wt 225.4 lb

## 2013-01-04 DIAGNOSIS — F4312 Post-traumatic stress disorder, chronic: Secondary | ICD-10-CM

## 2013-01-04 DIAGNOSIS — F4310 Post-traumatic stress disorder, unspecified: Secondary | ICD-10-CM

## 2013-01-04 DIAGNOSIS — F3162 Bipolar disorder, current episode mixed, moderate: Secondary | ICD-10-CM

## 2013-01-04 MED ORDER — SERTRALINE HCL 100 MG PO TABS: 100.0000 mg | ORAL_TABLET | Freq: Every day | ORAL | Status: DC

## 2013-01-04 MED ORDER — BUPROPION HCL ER (XL) 300 MG PO TB24: ORAL_TABLET | ORAL | Status: DC

## 2013-01-04 MED ORDER — LITHIUM CARBONATE 300 MG PO CAPS: ORAL_CAPSULE | ORAL | Status: DC

## 2013-01-04 MED ORDER — TEMAZEPAM 15 MG PO CAPS: ORAL_CAPSULE | ORAL | Status: DC
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
## 2013-01-07 ENCOUNTER — Ambulatory Visit (INDEPENDENT_AMBULATORY_CARE_PROVIDER_SITE_OTHER): Payer: Managed Care, Other (non HMO) | Admitting: Licensed Clinical Social Worker

## 2013-01-07 DIAGNOSIS — F4310 Post-traumatic stress disorder, unspecified: Secondary | ICD-10-CM

## 2013-01-07 DIAGNOSIS — F3162 Bipolar disorder, current episode mixed, moderate: Secondary | ICD-10-CM

## 2013-01-07 NOTE — Progress Notes (Signed)
   THERAPIST PROGRESS NOTE  Session Time: 10:30am-11:30am  Participation Level: Active  Behavioral Response: Well GroomedAlertAnxious  Type of Therapy: Individual Therapy  Treatment Goals addressed: Anxiety and Coping  Interventions: CBT, Strength-based, Supportive and Reframing  Summary: Jim Owens is a 52 y.o. male who presents with euthymic mood and anxious affect. He reports that he is doing well with well controlled mood stability. He will leave tomorrow to go home for the holidays and is looking forward to this. He states that this is the first holiday in a while that he is looking forward to. He expresses frustration over his tendency to spend a lot of time searching for items to purchase on the Internet. He is bothered by this, feels he is wasting his time and tries to redirect his thinking. Discussed goals and reviewed progress. Patient feels he is making very good progress. His sleep and appetite are wnl.  Suicidal/Homicidal: Nowithout intent/plan  Therapist Response: Assessed patients current functioning and reviewed progress. Reviewed coping strategies. Assessed patients safety and assisted in identifying protective factors.  Reviewed crisis plan with patient. Assisted patient with the expression of anxiety and frustration. Reviewed patients self care plan. Assessed progress related to self care. Patients self care is good. Recommend daily exercise, increased socialization and recreation. Used CBT to assist patient with the identification of negative distortions and irrational thoughts. Encouraged patient to verbalize alternative and factual responses which challenge thought distortions.  Plan: Return again in two weeks. Patient will develop an awareness of obsessive behavior around purchasing items, Patient will use cognitive behavioral techniques to reframe negative, automatic thoughts. Patient will identify and participate in additional recreational activities, patient will  continue to journal daily and ice skate for stress reduction.   Diagnosis: Axis I: Bipolar, Depressed and Post Traumatic Stress Disorder    Axis II: No diagnosis    Sayan Aldava, LCSW 01/07/2013

## 2013-02-06 ENCOUNTER — Ambulatory Visit (INDEPENDENT_AMBULATORY_CARE_PROVIDER_SITE_OTHER): Payer: Managed Care, Other (non HMO) | Admitting: Licensed Clinical Social Worker

## 2013-02-06 DIAGNOSIS — F4310 Post-traumatic stress disorder, unspecified: Secondary | ICD-10-CM

## 2013-02-06 NOTE — Progress Notes (Signed)
   THERAPIST PROGRESS NOTE  Session Time: 8:30am-9:20am  Participation Level: Active  Behavioral Response: Well GroomedAlertEuthymic  Type of Therapy: Individual Therapy  Treatment Goals addressed: Coping  Interventions: Strength-based and Supportive  Summary: Jim Owens is a 53 y.o. male who presents with euthymic mood and bright affect. He reports that he has been doing well and that he enjoyed his holidays. He learned that his mother has a heart condition and may require surgery. He reports an increased awareness of his mothers age and processes his natural fear of her death. He feels he is managing triggers to his trauma well and continues to focus on skating and writing. Informed patient of this writers departure and discussed referral to the [place] practice since it is closer to his home. Will discuss further at his next appointment. His sleep and appetite are wnl.    Suicidal/Homicidal: Nowithout intent/plan  Therapist Response: Assessed patients current functioning and reviewed progress. Reviewed coping strategies. Assessed patients safety and assisted in identifying protective factors.  Reviewed crisis plan with patient. Assisted patient with the expression of fear over his mothers health. Reviewed patients self care plan. Assessed progress related to self care. Patients self care is good. Recommend daily exercise, increased socialization and recreation.  Plan: Return again in two weeks.Patient will develop an awareness of obsessive behavior around purchasing items, Patient will use cognitive behavioral techniques to reframe negative, automatic thoughts. Patient will identify and participate in additional recreational activities, patient will continue to journal daily and ice skate for stress reduction.    Diagnosis: Axis I: Post Traumatic Stress Disorder    Axis II: No diagnosis    Amadi Yoshino, LCSW 02/06/2013

## 2013-02-18 ENCOUNTER — Ambulatory Visit (INDEPENDENT_AMBULATORY_CARE_PROVIDER_SITE_OTHER): Payer: Managed Care, Other (non HMO) | Admitting: Licensed Clinical Social Worker

## 2013-02-18 DIAGNOSIS — F3162 Bipolar disorder, current episode mixed, moderate: Secondary | ICD-10-CM

## 2013-02-18 DIAGNOSIS — F4310 Post-traumatic stress disorder, unspecified: Secondary | ICD-10-CM

## 2013-02-18 NOTE — Progress Notes (Signed)
   THERAPIST PROGRESS NOTE  Session Time: 10:30am-11:20am  Participation Level: Active  Behavioral Response: Well GroomedAlertEuthymic  Type of Therapy: Individual Therapy  Treatment Goals addressed: Coping  Interventions: CBT, Strength-based, Supportive and Reframing  Summary: Jim Owens is a 53 y.o. male who presents with euthymic mood and bright affect. He reports that he has been doing well and feels that he has made signficant progress in while in therapy here. He processes how he has adapted to his new life and processes his ongoing challenges of PTSD. He remains hypervigilant and states that he does not feel as safe in the world as he would like. He reports learning something about himself that he wants to share with his new therapist. He did not explore this further.    Suicidal/Homicidal: Nowithout intent/plan  Therapist Response: Assessed patients current functioning and reviewed progress. Reviewed coping strategies. Assessed patients safety and assisted in identifying protective factors.  Reviewed crisis plan with patient.. Reviewed patients self care plan. Assessed progress related to self care. Patients self care is good. Recommend daily exercise, increased socialization and recreation. Used CBT to assist patient with the identification of negative distortions and irrational thoughts. Encouraged patient to verbalize alternative and factual responses which challenge thought distortions. Reviewed healthy boundaries and assertive communication.   Plan: follow up with [facility].   Diagnosis: Axis I: Bipolar, Depressed and Post Traumatic Stress Disorder    Axis II: No diagnosis    Sanaii Caporaso, LCSW 02/18/2013

## 2013-03-02 ENCOUNTER — Other Ambulatory Visit (HOSPITAL_COMMUNITY): Payer: Self-pay | Admitting: Psychiatry

## 2013-03-04 ENCOUNTER — Ambulatory Visit (HOSPITAL_COMMUNITY): Payer: Managed Care, Other (non HMO) | Admitting: Licensed Clinical Social Worker

## 2013-04-05 ENCOUNTER — Ambulatory Visit (HOSPITAL_COMMUNITY): Payer: Managed Care, Other (non HMO) | Admitting: Psychiatry

## 2013-04-17 ENCOUNTER — Ambulatory Visit (INDEPENDENT_AMBULATORY_CARE_PROVIDER_SITE_OTHER): Payer: Managed Care, Other (non HMO) | Admitting: Psychology

## 2013-04-17 DIAGNOSIS — F4310 Post-traumatic stress disorder, unspecified: Secondary | ICD-10-CM

## 2013-04-26 ENCOUNTER — Ambulatory Visit (HOSPITAL_COMMUNITY): Payer: Managed Care, Other (non HMO) | Admitting: Psychology

## 2013-04-26 ENCOUNTER — Ambulatory Visit (INDEPENDENT_AMBULATORY_CARE_PROVIDER_SITE_OTHER): Payer: Managed Care, Other (non HMO) | Admitting: Psychology

## 2013-04-26 DIAGNOSIS — F4310 Post-traumatic stress disorder, unspecified: Secondary | ICD-10-CM

## 2013-05-03 ENCOUNTER — Ambulatory Visit (HOSPITAL_COMMUNITY): Payer: Managed Care, Other (non HMO) | Admitting: Psychiatry

## 2013-05-27 ENCOUNTER — Ambulatory Visit (INDEPENDENT_AMBULATORY_CARE_PROVIDER_SITE_OTHER): Payer: Managed Care, Other (non HMO) | Admitting: Psychology

## 2013-05-27 DIAGNOSIS — F4310 Post-traumatic stress disorder, unspecified: Secondary | ICD-10-CM

## 2013-06-05 ENCOUNTER — Ambulatory Visit (INDEPENDENT_AMBULATORY_CARE_PROVIDER_SITE_OTHER): Payer: Managed Care, Other (non HMO) | Admitting: Psychiatry

## 2013-06-05 VITALS — BP 142/93 | HR 81 | Ht 74.0 in | Wt 237.0 lb

## 2013-06-05 DIAGNOSIS — F4312 Post-traumatic stress disorder, chronic: Secondary | ICD-10-CM

## 2013-06-05 DIAGNOSIS — F3162 Bipolar disorder, current episode mixed, moderate: Secondary | ICD-10-CM

## 2013-06-05 DIAGNOSIS — F4310 Post-traumatic stress disorder, unspecified: Secondary | ICD-10-CM

## 2013-06-05 MED ORDER — BUPROPION HCL ER (XL) 300 MG PO TB24: ORAL_TABLET | ORAL | Status: DC

## 2013-06-05 MED ORDER — SERTRALINE HCL 100 MG PO TABS
100.0000 mg | ORAL_TABLET | Freq: Every day | ORAL | Status: DC
Start: 2013-06-05 — End: 2013-10-16

## 2013-06-05 MED ORDER — TEMAZEPAM 15 MG PO CAPS: ORAL_CAPSULE | ORAL | Status: DC

## 2013-06-05 MED ORDER — LITHIUM CARBONATE 300 MG PO CAPS: ORAL_CAPSULE | ORAL | Status: DC
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
## 2013-06-11 ENCOUNTER — Ambulatory Visit (INDEPENDENT_AMBULATORY_CARE_PROVIDER_SITE_OTHER): Payer: Managed Care, Other (non HMO) | Admitting: Psychology

## 2013-06-11 DIAGNOSIS — F4310 Post-traumatic stress disorder, unspecified: Secondary | ICD-10-CM

## 2013-06-26 ENCOUNTER — Ambulatory Visit (INDEPENDENT_AMBULATORY_CARE_PROVIDER_SITE_OTHER): Payer: Managed Care, Other (non HMO) | Admitting: Psychology

## 2013-06-26 DIAGNOSIS — F4310 Post-traumatic stress disorder, unspecified: Secondary | ICD-10-CM

## 2013-07-05 ENCOUNTER — Other Ambulatory Visit (HOSPITAL_COMMUNITY): Payer: Self-pay | Admitting: Psychiatry

## 2013-07-08 ENCOUNTER — Encounter: Payer: Self-pay | Admitting: Psychiatry

## 2013-07-09 ENCOUNTER — Other Ambulatory Visit (HOSPITAL_COMMUNITY): Payer: Self-pay | Admitting: Psychiatry

## 2013-07-10 ENCOUNTER — Other Ambulatory Visit (HOSPITAL_COMMUNITY): Payer: Self-pay | Admitting: *Deleted

## 2013-07-10 DIAGNOSIS — F4310 Post-traumatic stress disorder, unspecified: Secondary | ICD-10-CM

## 2013-07-10 MED ORDER — TEMAZEPAM 15 MG PO CAPS: ORAL_CAPSULE | ORAL | Status: DC

## 2013-07-10 NOTE — Telephone Encounter (Signed)
Patient left EQ:ASTMHDVM:States he lost his prescription for Temazepam given to himm 06/05/13 by MD. [facility]?  New Rx authorized by Dr. Donell BeersPlovsky - [facility] to [facility] exactly as written by MD 06/05/13

## 2013-07-15 ENCOUNTER — Ambulatory Visit (INDEPENDENT_AMBULATORY_CARE_PROVIDER_SITE_OTHER): Payer: Managed Care, Other (non HMO) | Admitting: Psychology

## 2013-07-15 DIAGNOSIS — F4310 Post-traumatic stress disorder, unspecified: Secondary | ICD-10-CM

## 2013-08-20 ENCOUNTER — Ambulatory Visit (INDEPENDENT_AMBULATORY_CARE_PROVIDER_SITE_OTHER): Payer: Managed Care, Other (non HMO) | Admitting: Psychology

## 2013-08-20 DIAGNOSIS — F4310 Post-traumatic stress disorder, unspecified: Secondary | ICD-10-CM

## 2013-09-03 ENCOUNTER — Ambulatory Visit (INDEPENDENT_AMBULATORY_CARE_PROVIDER_SITE_OTHER): Payer: Managed Care, Other (non HMO) | Admitting: Psychology

## 2013-09-03 DIAGNOSIS — F4310 Post-traumatic stress disorder, unspecified: Secondary | ICD-10-CM

## 2013-09-18 ENCOUNTER — Ambulatory Visit (INDEPENDENT_AMBULATORY_CARE_PROVIDER_SITE_OTHER): Payer: Managed Care, Other (non HMO) | Admitting: Psychology

## 2013-09-18 DIAGNOSIS — F4310 Post-traumatic stress disorder, unspecified: Secondary | ICD-10-CM

## 2013-10-02 ENCOUNTER — Ambulatory Visit (INDEPENDENT_AMBULATORY_CARE_PROVIDER_SITE_OTHER): Payer: Managed Care, Other (non HMO) | Admitting: Psychology

## 2013-10-02 DIAGNOSIS — F4310 Post-traumatic stress disorder, unspecified: Secondary | ICD-10-CM

## 2013-10-16 ENCOUNTER — Ambulatory Visit (INDEPENDENT_AMBULATORY_CARE_PROVIDER_SITE_OTHER): Payer: Managed Care, Other (non HMO) | Admitting: Psychiatry

## 2013-10-16 VITALS — BP 125/81 | HR 76 | Ht 74.0 in | Wt 234.0 lb

## 2013-10-16 DIAGNOSIS — F4312 Post-traumatic stress disorder, chronic: Secondary | ICD-10-CM

## 2013-10-16 DIAGNOSIS — F4310 Post-traumatic stress disorder, unspecified: Secondary | ICD-10-CM

## 2013-10-16 DIAGNOSIS — F3162 Bipolar disorder, current episode mixed, moderate: Secondary | ICD-10-CM

## 2013-10-16 MED ORDER — BUPROPION HCL ER (XL) 300 MG PO TB24: ORAL_TABLET | ORAL | Status: AC

## 2013-10-16 MED ORDER — TEMAZEPAM 15 MG PO CAPS: ORAL_CAPSULE | ORAL | Status: AC

## 2013-10-16 MED ORDER — SERTRALINE HCL 100 MG PO TABS: 100.0000 mg | ORAL_TABLET | Freq: Every day | ORAL | Status: DC

## 2013-10-16 MED ORDER — LITHIUM CARBONATE 300 MG PO CAPS: ORAL_CAPSULE | ORAL | Status: AC
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
## 2013-10-25 ENCOUNTER — Ambulatory Visit: Payer: Managed Care, Other (non HMO) | Admitting: Psychology

## 2013-10-28 ENCOUNTER — Ambulatory Visit: Payer: Managed Care, Other (non HMO) | Admitting: Psychology

## 2013-11-06 ENCOUNTER — Ambulatory Visit (INDEPENDENT_AMBULATORY_CARE_PROVIDER_SITE_OTHER): Payer: Managed Care, Other (non HMO) | Admitting: Psychology

## 2013-11-06 DIAGNOSIS — F4310 Post-traumatic stress disorder, unspecified: Secondary | ICD-10-CM

## 2013-11-19 ENCOUNTER — Ambulatory Visit (INDEPENDENT_AMBULATORY_CARE_PROVIDER_SITE_OTHER): Payer: Managed Care, Other (non HMO) | Admitting: Psychology

## 2013-11-19 DIAGNOSIS — F4310 Post-traumatic stress disorder, unspecified: Secondary | ICD-10-CM

## 2013-12-16 ENCOUNTER — Ambulatory Visit (INDEPENDENT_AMBULATORY_CARE_PROVIDER_SITE_OTHER): Payer: Managed Care, Other (non HMO) | Admitting: Psychology

## 2013-12-16 DIAGNOSIS — F4310 Post-traumatic stress disorder, unspecified: Secondary | ICD-10-CM

## 2013-12-30 ENCOUNTER — Ambulatory Visit (INDEPENDENT_AMBULATORY_CARE_PROVIDER_SITE_OTHER): Payer: Managed Care, Other (non HMO) | Admitting: Psychology

## 2013-12-30 DIAGNOSIS — F4310 Post-traumatic stress disorder, unspecified: Secondary | ICD-10-CM

## 2014-01-06 ENCOUNTER — Other Ambulatory Visit (HOSPITAL_COMMUNITY): Payer: Self-pay | Admitting: Psychiatry

## 2014-01-20 ENCOUNTER — Ambulatory Visit (INDEPENDENT_AMBULATORY_CARE_PROVIDER_SITE_OTHER): Payer: Managed Care, Other (non HMO) | Admitting: Psychology

## 2014-01-20 DIAGNOSIS — F4310 Post-traumatic stress disorder, unspecified: Secondary | ICD-10-CM

## 2014-02-03 ENCOUNTER — Ambulatory Visit: Payer: Managed Care, Other (non HMO) | Admitting: Psychology

## 2014-02-21 ENCOUNTER — Ambulatory Visit (INDEPENDENT_AMBULATORY_CARE_PROVIDER_SITE_OTHER): Payer: Managed Care, Other (non HMO) | Admitting: Psychology

## 2014-02-21 DIAGNOSIS — F4310 Post-traumatic stress disorder, unspecified: Secondary | ICD-10-CM

## 2014-02-25 ENCOUNTER — Other Ambulatory Visit (HOSPITAL_COMMUNITY): Payer: Self-pay | Admitting: Psychiatry

## 2014-03-03 NOTE — Telephone Encounter (Signed)
Restoril order denied as patient was given a new order 10/16/13 with 5 refills. Next appointment 03/19/14.

## 2014-03-07 ENCOUNTER — Ambulatory Visit (INDEPENDENT_AMBULATORY_CARE_PROVIDER_SITE_OTHER): Payer: Managed Care, Other (non HMO) | Admitting: Psychology

## 2014-03-07 DIAGNOSIS — F4310 Post-traumatic stress disorder, unspecified: Secondary | ICD-10-CM

## 2014-03-19 ENCOUNTER — Ambulatory Visit (HOSPITAL_COMMUNITY): Payer: Managed Care, Other (non HMO) | Admitting: Psychiatry

## 2014-03-21 ENCOUNTER — Ambulatory Visit (INDEPENDENT_AMBULATORY_CARE_PROVIDER_SITE_OTHER): Payer: Managed Care, Other (non HMO) | Admitting: Psychology

## 2014-03-21 DIAGNOSIS — F4310 Post-traumatic stress disorder, unspecified: Secondary | ICD-10-CM

## 2014-04-18 ENCOUNTER — Ambulatory Visit (HOSPITAL_COMMUNITY): Payer: Managed Care, Other (non HMO) | Admitting: Psychiatry

## 2014-04-19 ENCOUNTER — Other Ambulatory Visit (HOSPITAL_COMMUNITY): Payer: Self-pay | Admitting: Psychiatry

## 2014-05-02 NOTE — Telephone Encounter (Signed)
Met with Dr. Casimiro Needle to discuss patient has not been seen since 10/16/13 and canceled appointments 04/18/14 and 03/19/14.  Instructed pharmacy to request patient make an appointment for further refills of Restoril per Dr. Casimiro Needle instruction. No refills authorized for Restoril until patient seen for evaluation.

## 2014-05-13 ENCOUNTER — Telehealth (HOSPITAL_COMMUNITY): Payer: Self-pay | Admitting: *Deleted

## 2014-05-13 NOTE — Telephone Encounter (Signed)
Dr. Donell BeersPlovsky,   Patient called. Per patient his insurance [place] not cover [place] in [place].  Patient is seeing a new doctor in [place] at [facility] [place].  Their number is [phone]. Patient stated he needs you to call them because they need you to talk to them as a referral so he can be seen at their office.   I advised patient I will need a release signed to release any information to new doctor's office.  Patient advised that we mail the release to him. Verified address with patient that's in the system, address matches.

## 2014-08-01 ENCOUNTER — Ambulatory Visit (INDEPENDENT_AMBULATORY_CARE_PROVIDER_SITE_OTHER): Payer: BLUE CROSS/BLUE SHIELD | Admitting: Psychology

## 2014-08-01 DIAGNOSIS — F4310 Post-traumatic stress disorder, unspecified: Secondary | ICD-10-CM | POA: Diagnosis not present

## 2014-08-22 ENCOUNTER — Ambulatory Visit (INDEPENDENT_AMBULATORY_CARE_PROVIDER_SITE_OTHER): Payer: BLUE CROSS/BLUE SHIELD | Admitting: Psychology

## 2014-08-22 DIAGNOSIS — F4310 Post-traumatic stress disorder, unspecified: Secondary | ICD-10-CM

## 2014-09-19 ENCOUNTER — Ambulatory Visit (INDEPENDENT_AMBULATORY_CARE_PROVIDER_SITE_OTHER): Payer: BLUE CROSS/BLUE SHIELD | Admitting: Psychology

## 2014-09-19 DIAGNOSIS — F4310 Post-traumatic stress disorder, unspecified: Secondary | ICD-10-CM

## 2014-10-14 ENCOUNTER — Ambulatory Visit (INDEPENDENT_AMBULATORY_CARE_PROVIDER_SITE_OTHER): Payer: BLUE CROSS/BLUE SHIELD | Admitting: Psychology

## 2014-10-14 DIAGNOSIS — F4310 Post-traumatic stress disorder, unspecified: Secondary | ICD-10-CM

## 2014-11-03 ENCOUNTER — Ambulatory Visit: Payer: BLUE CROSS/BLUE SHIELD | Admitting: Psychology

## 2014-11-27 ENCOUNTER — Ambulatory Visit (INDEPENDENT_AMBULATORY_CARE_PROVIDER_SITE_OTHER): Payer: BLUE CROSS/BLUE SHIELD | Admitting: Psychology

## 2014-11-27 DIAGNOSIS — F4310 Post-traumatic stress disorder, unspecified: Secondary | ICD-10-CM

## 2014-12-10 ENCOUNTER — Ambulatory Visit (INDEPENDENT_AMBULATORY_CARE_PROVIDER_SITE_OTHER): Payer: BLUE CROSS/BLUE SHIELD | Admitting: Psychology

## 2014-12-10 DIAGNOSIS — F4310 Post-traumatic stress disorder, unspecified: Secondary | ICD-10-CM

## 2014-12-31 ENCOUNTER — Ambulatory Visit (INDEPENDENT_AMBULATORY_CARE_PROVIDER_SITE_OTHER): Payer: BLUE CROSS/BLUE SHIELD | Admitting: Psychology

## 2014-12-31 DIAGNOSIS — F4310 Post-traumatic stress disorder, unspecified: Secondary | ICD-10-CM

## 2015-01-23 ENCOUNTER — Ambulatory Visit (INDEPENDENT_AMBULATORY_CARE_PROVIDER_SITE_OTHER): Payer: BLUE CROSS/BLUE SHIELD | Admitting: Psychology

## 2015-01-23 DIAGNOSIS — F4310 Post-traumatic stress disorder, unspecified: Secondary | ICD-10-CM | POA: Diagnosis not present

## 2015-02-13 ENCOUNTER — Ambulatory Visit (INDEPENDENT_AMBULATORY_CARE_PROVIDER_SITE_OTHER): Payer: BLUE CROSS/BLUE SHIELD | Admitting: Psychology

## 2015-02-13 DIAGNOSIS — F4310 Post-traumatic stress disorder, unspecified: Secondary | ICD-10-CM | POA: Diagnosis not present

## 2015-03-19 ENCOUNTER — Ambulatory Visit (INDEPENDENT_AMBULATORY_CARE_PROVIDER_SITE_OTHER): Payer: BLUE CROSS/BLUE SHIELD | Admitting: Psychology

## 2015-03-19 DIAGNOSIS — F4310 Post-traumatic stress disorder, unspecified: Secondary | ICD-10-CM

## 2015-04-09 ENCOUNTER — Ambulatory Visit (INDEPENDENT_AMBULATORY_CARE_PROVIDER_SITE_OTHER): Payer: BLUE CROSS/BLUE SHIELD | Admitting: Psychology

## 2015-04-09 DIAGNOSIS — F4310 Post-traumatic stress disorder, unspecified: Secondary | ICD-10-CM | POA: Diagnosis not present

## 2015-05-14 ENCOUNTER — Ambulatory Visit (INDEPENDENT_AMBULATORY_CARE_PROVIDER_SITE_OTHER): Payer: BLUE CROSS/BLUE SHIELD | Admitting: Psychology

## 2015-05-14 DIAGNOSIS — F4310 Post-traumatic stress disorder, unspecified: Secondary | ICD-10-CM

## 2015-06-04 ENCOUNTER — Ambulatory Visit (INDEPENDENT_AMBULATORY_CARE_PROVIDER_SITE_OTHER): Payer: BLUE CROSS/BLUE SHIELD | Admitting: Psychology

## 2015-06-04 DIAGNOSIS — F4310 Post-traumatic stress disorder, unspecified: Secondary | ICD-10-CM

## 2015-06-25 ENCOUNTER — Ambulatory Visit (INDEPENDENT_AMBULATORY_CARE_PROVIDER_SITE_OTHER): Payer: BLUE CROSS/BLUE SHIELD | Admitting: Psychology

## 2015-06-25 DIAGNOSIS — F4310 Post-traumatic stress disorder, unspecified: Secondary | ICD-10-CM

## 2015-08-03 ENCOUNTER — Ambulatory Visit (INDEPENDENT_AMBULATORY_CARE_PROVIDER_SITE_OTHER): Payer: BLUE CROSS/BLUE SHIELD | Admitting: Psychology

## 2015-08-03 DIAGNOSIS — F4310 Post-traumatic stress disorder, unspecified: Secondary | ICD-10-CM | POA: Diagnosis not present

## 2015-08-24 ENCOUNTER — Ambulatory Visit (INDEPENDENT_AMBULATORY_CARE_PROVIDER_SITE_OTHER): Payer: BLUE CROSS/BLUE SHIELD | Admitting: Psychology

## 2015-08-24 DIAGNOSIS — F4310 Post-traumatic stress disorder, unspecified: Secondary | ICD-10-CM | POA: Diagnosis not present

## 2015-09-23 ENCOUNTER — Ambulatory Visit (INDEPENDENT_AMBULATORY_CARE_PROVIDER_SITE_OTHER): Payer: BLUE CROSS/BLUE SHIELD | Admitting: Psychology

## 2015-09-23 DIAGNOSIS — F4310 Post-traumatic stress disorder, unspecified: Secondary | ICD-10-CM

## 2015-10-09 ENCOUNTER — Ambulatory Visit (INDEPENDENT_AMBULATORY_CARE_PROVIDER_SITE_OTHER): Payer: BLUE CROSS/BLUE SHIELD | Admitting: Psychology

## 2015-10-09 DIAGNOSIS — F4310 Post-traumatic stress disorder, unspecified: Secondary | ICD-10-CM | POA: Diagnosis not present

## 2015-10-28 ENCOUNTER — Ambulatory Visit (INDEPENDENT_AMBULATORY_CARE_PROVIDER_SITE_OTHER): Payer: BLUE CROSS/BLUE SHIELD | Admitting: Psychology

## 2015-10-28 DIAGNOSIS — F4310 Post-traumatic stress disorder, unspecified: Secondary | ICD-10-CM

## 2015-11-18 ENCOUNTER — Ambulatory Visit (INDEPENDENT_AMBULATORY_CARE_PROVIDER_SITE_OTHER): Payer: BLUE CROSS/BLUE SHIELD | Admitting: Psychology

## 2015-11-18 DIAGNOSIS — F4310 Post-traumatic stress disorder, unspecified: Secondary | ICD-10-CM

## 2015-12-08 ENCOUNTER — Ambulatory Visit (INDEPENDENT_AMBULATORY_CARE_PROVIDER_SITE_OTHER): Payer: BLUE CROSS/BLUE SHIELD | Admitting: Psychology

## 2015-12-08 DIAGNOSIS — F4320 Adjustment disorder, unspecified: Secondary | ICD-10-CM

## 2016-01-05 ENCOUNTER — Ambulatory Visit (INDEPENDENT_AMBULATORY_CARE_PROVIDER_SITE_OTHER): Payer: BLUE CROSS/BLUE SHIELD | Admitting: Psychology

## 2016-01-05 DIAGNOSIS — F4310 Post-traumatic stress disorder, unspecified: Secondary | ICD-10-CM | POA: Diagnosis not present

## 2016-02-08 ENCOUNTER — Ambulatory Visit (INDEPENDENT_AMBULATORY_CARE_PROVIDER_SITE_OTHER): Payer: BLUE CROSS/BLUE SHIELD | Admitting: Psychology

## 2016-02-08 DIAGNOSIS — F4310 Post-traumatic stress disorder, unspecified: Secondary | ICD-10-CM

## 2016-02-22 ENCOUNTER — Ambulatory Visit (INDEPENDENT_AMBULATORY_CARE_PROVIDER_SITE_OTHER): Payer: BLUE CROSS/BLUE SHIELD | Admitting: Psychology

## 2016-02-22 DIAGNOSIS — F4310 Post-traumatic stress disorder, unspecified: Secondary | ICD-10-CM

## 2016-03-21 ENCOUNTER — Ambulatory Visit (INDEPENDENT_AMBULATORY_CARE_PROVIDER_SITE_OTHER): Payer: BLUE CROSS/BLUE SHIELD | Admitting: Psychology

## 2016-03-21 DIAGNOSIS — F4310 Post-traumatic stress disorder, unspecified: Secondary | ICD-10-CM | POA: Diagnosis not present

## 2016-04-26 ENCOUNTER — Ambulatory Visit (INDEPENDENT_AMBULATORY_CARE_PROVIDER_SITE_OTHER): Payer: BLUE CROSS/BLUE SHIELD | Admitting: Psychology

## 2016-04-26 DIAGNOSIS — F4310 Post-traumatic stress disorder, unspecified: Secondary | ICD-10-CM

## 2016-05-19 ENCOUNTER — Ambulatory Visit (INDEPENDENT_AMBULATORY_CARE_PROVIDER_SITE_OTHER): Payer: BLUE CROSS/BLUE SHIELD | Admitting: Psychology

## 2016-05-19 DIAGNOSIS — F4310 Post-traumatic stress disorder, unspecified: Secondary | ICD-10-CM | POA: Diagnosis not present

## 2016-06-14 ENCOUNTER — Ambulatory Visit: Payer: BLUE CROSS/BLUE SHIELD | Admitting: Psychology

## 2016-06-27 ENCOUNTER — Ambulatory Visit (INDEPENDENT_AMBULATORY_CARE_PROVIDER_SITE_OTHER): Payer: BLUE CROSS/BLUE SHIELD | Admitting: Psychology

## 2016-06-27 DIAGNOSIS — F4310 Post-traumatic stress disorder, unspecified: Secondary | ICD-10-CM

## 2016-07-25 ENCOUNTER — Ambulatory Visit (INDEPENDENT_AMBULATORY_CARE_PROVIDER_SITE_OTHER): Payer: BLUE CROSS/BLUE SHIELD | Admitting: Psychology

## 2016-07-25 DIAGNOSIS — F4310 Post-traumatic stress disorder, unspecified: Secondary | ICD-10-CM

## 2016-08-31 ENCOUNTER — Ambulatory Visit: Payer: BLUE CROSS/BLUE SHIELD | Admitting: Psychology

## 2016-09-30 ENCOUNTER — Ambulatory Visit (INDEPENDENT_AMBULATORY_CARE_PROVIDER_SITE_OTHER): Payer: BLUE CROSS/BLUE SHIELD | Admitting: Psychology

## 2016-09-30 DIAGNOSIS — F4310 Post-traumatic stress disorder, unspecified: Secondary | ICD-10-CM | POA: Diagnosis not present

## 2016-10-19 ENCOUNTER — Ambulatory Visit (INDEPENDENT_AMBULATORY_CARE_PROVIDER_SITE_OTHER): Payer: BLUE CROSS/BLUE SHIELD | Admitting: Psychology

## 2016-10-19 DIAGNOSIS — F4310 Post-traumatic stress disorder, unspecified: Secondary | ICD-10-CM

## 2016-11-16 ENCOUNTER — Emergency Department (HOSPITAL_COMMUNITY)
Admission: EM | Admit: 2016-11-16 | Discharge: 2016-11-16 | Disposition: A | Discharge status: Home / Self Care | Payer: BLUE CROSS/BLUE SHIELD | Attending: Emergency Medicine | Admitting: Emergency Medicine

## 2016-11-16 ENCOUNTER — Ambulatory Visit (INDEPENDENT_AMBULATORY_CARE_PROVIDER_SITE_OTHER): Payer: BLUE CROSS/BLUE SHIELD | Admitting: Psychology

## 2016-11-16 ENCOUNTER — Emergency Department (HOSPITAL_COMMUNITY): Payer: BLUE CROSS/BLUE SHIELD

## 2016-11-16 ENCOUNTER — Encounter (HOSPITAL_COMMUNITY): Payer: Self-pay | Admitting: Emergency Medicine

## 2016-11-16 DIAGNOSIS — S060X1A Concussion with loss of consciousness of 30 minutes or less, initial encounter: Secondary | ICD-10-CM | POA: Insufficient documentation

## 2016-11-16 DIAGNOSIS — F4320 Adjustment disorder, unspecified: Secondary | ICD-10-CM | POA: Diagnosis not present

## 2016-11-16 DIAGNOSIS — R51 Headache: Secondary | ICD-10-CM | POA: Diagnosis not present

## 2016-11-16 DIAGNOSIS — Y998 Other external cause status: Secondary | ICD-10-CM | POA: Diagnosis not present

## 2016-11-16 DIAGNOSIS — S0990XA Unspecified injury of head, initial encounter: Secondary | ICD-10-CM | POA: Diagnosis present

## 2016-11-16 DIAGNOSIS — Y929 Unspecified place or not applicable: Secondary | ICD-10-CM | POA: Diagnosis not present

## 2016-11-16 DIAGNOSIS — Y9321 Activity, ice skating: Secondary | ICD-10-CM | POA: Diagnosis not present

## 2016-11-16 DIAGNOSIS — Z87891 Personal history of nicotine dependence: Secondary | ICD-10-CM | POA: Diagnosis not present

## 2016-11-16 DIAGNOSIS — Z79899 Other long term (current) drug therapy: Secondary | ICD-10-CM | POA: Insufficient documentation

## 2016-11-16 DIAGNOSIS — S0083XA Contusion of other part of head, initial encounter: Secondary | ICD-10-CM | POA: Diagnosis not present

## 2016-11-16 MED ORDER — HYDROCODONE-ACETAMINOPHEN 5-325 MG PO TABS: 2.0000 | ORAL_TABLET | ORAL | 0 refills | Status: DC | PRN

## 2016-11-16 MED ORDER — ONDANSETRON 4 MG PO TBDP: 4.0000 mg | ORAL_TABLET | Freq: Three times a day (TID) | ORAL | 0 refills | Status: AC | PRN

## 2016-11-16 MED ORDER — ONDANSETRON HCL 4 MG/2ML IJ SOLN
4.0000 mg | Freq: Once | INTRAMUSCULAR | Status: AC
  Administered 2016-11-16: 4 mg via INTRAVENOUS
  Filled 2016-11-16: qty 2

## 2016-11-16 MED ORDER — MORPHINE SULFATE (PF) 4 MG/ML IV SOLN
4.0000 mg | INTRAVENOUS | Status: DC | PRN
  Administered 2016-11-16: 4 mg via INTRAVENOUS

## 2016-11-16 MED ORDER — HYDROCODONE-ACETAMINOPHEN 5-325 MG PO TABS: 2.0000 | ORAL_TABLET | ORAL | 0 refills | Status: AC | PRN

## 2016-11-16 MED ORDER — ONDANSETRON 4 MG PO TBDP: 4.0000 mg | ORAL_TABLET | Freq: Three times a day (TID) | ORAL | 0 refills | Status: DC | PRN

## 2016-11-16 MED ORDER — MORPHINE SULFATE (PF) 4 MG/ML IV SOLN
4.0000 mg | INTRAVENOUS | Status: DC | PRN
  Administered 2016-11-16: 4 mg via INTRAVENOUS
  Filled 2016-11-16 (×2): qty 1

## 2016-11-16 NOTE — ED Notes (Signed)
Patient back from CT.

## 2016-11-16 NOTE — Discharge Instructions (Signed)
Ice to bruised and swollen areas. Vicodin for pain. Follow-up with your primary care physician
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
## 2016-11-16 NOTE — ED Notes (Signed)
Patient transported to CT 

## 2016-11-16 NOTE — ED Provider Notes (Signed)
[facility]   CSN: [id number] Arrival date & time: 11/16/16  1430     History   Chief Complaint Chief Complaint  Patient presents with  . Loss of Consciousness  . Nausea    HPI Jim Owens is a 56 y.o. male. Chief complaint is head injury while ice skating  HPI:  The patient skin today. He wears a skull cap with some protective patting it but not a frank helmet. Does not remember the accident. Was noted to fall and go forward. He has laceration and some bleeding to his left eyebrow and eyelid. He states the next thing he remembers he was on the ice and people were standing all around me". No history of syncope. No history of seizures. He complains of headache and some neck pain.  Past Medical History:  Diagnosis Date  . Anxiety   . Depression   . Headache(784.0)   . PTSD (post-traumatic stress disorder)     Patient Active Problem List   Diagnosis Date Noted  . PTSD (post-traumatic stress disorder) 06/05/2012  . Bipolar 1 disorder, mixed, moderate (HCC) 06/05/2012  . H/O neck surgery 05/17/2012  . H/O eye surgery 05/17/2012  . Vision impairment 05/17/2012    Past Surgical History:  Procedure Laterality Date  . EYE SURGERY    . NECK SURGERY         Home Medications    Prior to Admission medications   Medication Sig Start Date End Date Taking? Authorizing Provider  buPROPion (WELLBUTRIN XL) 300 MG 24 hr tablet TAKE 1 TABLET EVERY MORNING Patient taking differently: Take 300 mg by mouth daily. TAKE 1 TABLET EVERY MORNING 10/16/13  Yes Plovsky, Earvin HansenGerald, MD  Cetirizine HCl (ZYRTEC ALLERGY) 10 MG CAPS Take 10 mg by mouth at bedtime.   Yes [provider]  lithium carbonate 300 MG capsule TAKE ONE CAPSULE BY MOUTH TWICE A DAY WITH MEAL Patient taking differently: Take 300 mg by mouth 2 (two) times daily with a meal. TAKE ONE CAPSULE BY MOUTH TWICE A DAY WITH MEAL 10/16/13  Yes Plovsky, Earvin HansenGerald, MD  Multiple  Vitamins-Minerals (DRY EYE FORMULA PO) Take 2 drops by mouth as needed (dry eye).   Yes [provider]  rizatriptan (MAXALT) 10 MG tablet Take 10 mg by mouth as needed. 10/25/16  Yes [provider]  sertraline (ZOLOFT) 100 MG tablet TAKE 1 TABLET (100 MG TOTAL) BY MOUTH DAILY. 01/08/14  Yes Plovsky, Earvin HansenGerald, MD  temazepam (RESTORIL) 15 MG capsule TAKE ONE CAPSULE BY MOUTH AT BEDTIME, NO EARLY REFILLS Patient taking differently: Take 15 mg by mouth at bedtime. TAKE ONE CAPSULE BY MOUTH AT BEDTIME, NO EARLY REFILLS 10/16/13  Yes Plovsky, Earvin HansenGerald, MD  topiramate (TOPAMAX) 50 MG tablet Take 150 mg by mouth at bedtime. 10/22/16  Yes [provider]  HYDROcodone-acetaminophen (NORCO/VICODIN) 5-325 MG tablet Take 2 tablets by mouth every 4 (four) hours as needed. 11/16/16   Arby BarrettePfeiffer, Marcy, MD  ondansetron (ZOFRAN ODT) 4 MG disintegrating tablet Take 1 tablet (4 mg total) by mouth every 8 (eight) hours as needed for nausea. 11/16/16   Arby BarrettePfeiffer, Marcy, MD    Family History Family History  Problem Relation Age of Onset  . Depression Brother   . Alcohol abuse Brother     Social History Social History   Tobacco Use  . Smoking status: Former Smoker    Packs/day: 0.50    Years: 1.00    Pack years: 0.50  Types: Cigarettes    Last attempt to quit: 09/26/2011    Years since quitting: 5.1  . Smokeless tobacco: Former Engineer, water Use Topics  . 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 Drug use: No     Allergies   Asa [aspirin]; Cefzil [cefprozil]; and Shellfish allergy   Review of Systems Review of Systems  Constitutional: Negative for appetite change, chills, diaphoresis, fatigue and fever.  HENT: Negative for mouth sores, sore throat and trouble swallowing.   Eyes: Negative for visual disturbance.  Respiratory: Negative for cough, chest tightness, shortness of breath and wheezing.   Cardiovascular: Negative for chest pain.  Gastrointestinal:  Negative for abdominal distention, abdominal pain, diarrhea, nausea and vomiting.  Endocrine: Negative for polydipsia, polyphagia and polyuria.  Genitourinary: Negative for dysuria, frequency and hematuria.  Musculoskeletal: Negative for gait problem.       Neck pain. No numbness weakness or tingling of extremities.  Skin: Negative for color change, pallor and rash.       Superficial laceration left eyebrow.  Neurological: Positive for headaches. Negative for dizziness, syncope and light-headedness.  Hematological: Does not bruise/bleed easily.  Psychiatric/Behavioral: Negative for behavioral problems and confusion.     Physical Exam Updated Vital Signs BP 106/86   Pulse 75   Resp (!) 23   Ht 6\' 2"  (1.88 m)   Wt 106.6 kg (235 lb)   SpO2 98%   BMI 30.17 kg/m   Physical Exam  Constitutional: He is oriented to person, place, and time. He appears well-developed and well-nourished. No distress.  Awake, alert 56 year old male in cervical collar.  HENT:  Head: Normocephalic.    Eyes: Pupils are equal, round, and reactive to light. Conjunctivae are normal. No scleral icterus.  Neck: Normal range of motion. Neck supple. No thyromegaly present.  Cardiovascular: Normal rate and regular rhythm.  Exam reveals no gallop and no friction rub.   No murmur heard. Pulmonary/Chest: Effort normal and breath sounds normal. No respiratory distress. He has no wheezes. He has no rales.  Abdominal: Soft. Bowel sounds are normal. He exhibits no distension. There is no tenderness. There is no rebound.  Musculoskeletal: Normal range of motion.  Neurological: He is alert and oriented to person, place, and time.  Normal symmetric Strength to shoulder shrug, triceps, biceps, grip,wrist flex/extend,and intrinsics  Norma lsymmetric sensation above and below clavicles, and to all distributions to UEs. Norma symmetric strength to flex/.extend hip and knees, dorsi/plantar flex ankles. Normal symmetric sensation  to all distributions to LEs Patellar and achilles reflexes 1-2+. Downgoing Babinski   Skin: Skin is warm and dry. No rash noted.  Psychiatric: He has a normal mood and affect. His behavior is normal.     ED Treatments / Results  Labs (all labs ordered are listed, but only abnormal results are displayed) Labs Reviewed - No data to display  EKG  EKG Interpretation  Date/Time:  Wednesday November 16 2016 14:40:08 EDT Ventricular Rate:  89 PR Interval:    QRS Duration: 112 QT Interval:  432 QTC Calculation: 398 R Axis:   38 Text Interpretation:  Sinus rhythm Ventricular bigeminy Probable left atrial enlargement Borderline intraventricular conduction delay Abnormal ekg Confirmed by Gerhard Munch [phone]) on 11/17/2016 8:54:40 PM       Radiology No results found.  Procedures Procedures (including critical care time)  Medications Ordered in ED Medications  ondansetron (ZOFRAN) injection 4 mg (4 mg Intravenous Given 11/16/16 1517)  ondansetron (ZOFRAN) injection  4 mg (4 mg Intravenous Given 11/16/16 1644)     Initial Impression / Assessment and Plan / ED Course  I have reviewed the triage vital signs and the nursing notes.  Pertinent labs & imaging results that were available during my care of the patient were reviewed by me and considered in my medical decision making (see chart for details).    Lacerations not in need of repair. Plan imaging of neck, head, maxillofacial. Rule out facial fracture, rule out skull fracture, rule out extra-axial bleeding,  Final Clinical Impressions(s) / ED Diagnoses   Final diagnoses:  Concussion with loss of consciousness of 30 minutes or less, initial encounter  Contusion of face, initial encounter    New Prescriptions This SmartLink is deprecated. Use AVSMEDLIST instead to display the medication list for a patient.   Rolland Porter, MD 11/25/16 [phone]

## 2016-11-16 NOTE — ED Notes (Signed)
ED Provider at bedside. Jim FearingJames

## 2016-12-27 ENCOUNTER — Ambulatory Visit: Payer: BLUE CROSS/BLUE SHIELD | Admitting: Psychology

## 2017-01-31 ENCOUNTER — Ambulatory Visit: Payer: BLUE CROSS/BLUE SHIELD | Admitting: Psychology

## 2017-02-01 ENCOUNTER — Ambulatory Visit (INDEPENDENT_AMBULATORY_CARE_PROVIDER_SITE_OTHER): Payer: BLUE CROSS/BLUE SHIELD | Admitting: Psychology

## 2017-02-01 DIAGNOSIS — F4310 Post-traumatic stress disorder, unspecified: Secondary | ICD-10-CM

## 2017-02-16 ENCOUNTER — Ambulatory Visit (INDEPENDENT_AMBULATORY_CARE_PROVIDER_SITE_OTHER): Payer: BLUE CROSS/BLUE SHIELD | Admitting: Psychology

## 2017-02-16 DIAGNOSIS — F4310 Post-traumatic stress disorder, unspecified: Secondary | ICD-10-CM

## 2017-03-16 ENCOUNTER — Ambulatory Visit (INDEPENDENT_AMBULATORY_CARE_PROVIDER_SITE_OTHER): Payer: BLUE CROSS/BLUE SHIELD | Admitting: Psychology

## 2017-03-16 DIAGNOSIS — F4310 Post-traumatic stress disorder, unspecified: Secondary | ICD-10-CM

## 2017-04-11 ENCOUNTER — Ambulatory Visit (INDEPENDENT_AMBULATORY_CARE_PROVIDER_SITE_OTHER): Payer: BLUE CROSS/BLUE SHIELD | Admitting: Psychology

## 2017-04-11 DIAGNOSIS — F4310 Post-traumatic stress disorder, unspecified: Secondary | ICD-10-CM

## 2017-05-10 ENCOUNTER — Ambulatory Visit (INDEPENDENT_AMBULATORY_CARE_PROVIDER_SITE_OTHER): Payer: BLUE CROSS/BLUE SHIELD | Admitting: Psychology

## 2017-05-10 DIAGNOSIS — F4310 Post-traumatic stress disorder, unspecified: Secondary | ICD-10-CM | POA: Diagnosis not present

## 2017-06-26 ENCOUNTER — Ambulatory Visit (INDEPENDENT_AMBULATORY_CARE_PROVIDER_SITE_OTHER): Payer: BLUE CROSS/BLUE SHIELD | Admitting: Psychology

## 2017-06-26 DIAGNOSIS — F4310 Post-traumatic stress disorder, unspecified: Secondary | ICD-10-CM

## 2017-07-21 ENCOUNTER — Ambulatory Visit (INDEPENDENT_AMBULATORY_CARE_PROVIDER_SITE_OTHER): Payer: BLUE CROSS/BLUE SHIELD | Admitting: Psychology

## 2017-07-21 DIAGNOSIS — F4310 Post-traumatic stress disorder, unspecified: Secondary | ICD-10-CM

## 2017-07-26 ENCOUNTER — Other Ambulatory Visit: Payer: BLUE CROSS/BLUE SHIELD | Admitting: Psychology

## 2017-08-22 ENCOUNTER — Ambulatory Visit: Payer: BLUE CROSS/BLUE SHIELD | Admitting: Psychology

## 2017-09-11 ENCOUNTER — Ambulatory Visit (INDEPENDENT_AMBULATORY_CARE_PROVIDER_SITE_OTHER): Payer: BLUE CROSS/BLUE SHIELD | Admitting: Psychology

## 2017-09-11 DIAGNOSIS — F4310 Post-traumatic stress disorder, unspecified: Secondary | ICD-10-CM | POA: Diagnosis not present

## 2017-10-02 ENCOUNTER — Ambulatory Visit: Payer: BLUE CROSS/BLUE SHIELD | Admitting: Psychology

## 2017-10-05 ENCOUNTER — Ambulatory Visit (INDEPENDENT_AMBULATORY_CARE_PROVIDER_SITE_OTHER): Payer: BLUE CROSS/BLUE SHIELD | Admitting: Psychology

## 2017-10-05 DIAGNOSIS — F4310 Post-traumatic stress disorder, unspecified: Secondary | ICD-10-CM

## 2017-10-31 ENCOUNTER — Ambulatory Visit (INDEPENDENT_AMBULATORY_CARE_PROVIDER_SITE_OTHER): Payer: BLUE CROSS/BLUE SHIELD | Admitting: Psychology

## 2017-10-31 DIAGNOSIS — F4310 Post-traumatic stress disorder, unspecified: Secondary | ICD-10-CM | POA: Diagnosis not present

## 2017-11-21 ENCOUNTER — Ambulatory Visit: Payer: BLUE CROSS/BLUE SHIELD | Admitting: Psychology

## 2018-01-02 ENCOUNTER — Ambulatory Visit (INDEPENDENT_AMBULATORY_CARE_PROVIDER_SITE_OTHER): Payer: BLUE CROSS/BLUE SHIELD | Admitting: Psychology

## 2018-01-02 DIAGNOSIS — F4310 Post-traumatic stress disorder, unspecified: Secondary | ICD-10-CM

## 2018-01-30 ENCOUNTER — Ambulatory Visit (INDEPENDENT_AMBULATORY_CARE_PROVIDER_SITE_OTHER): Payer: BLUE CROSS/BLUE SHIELD | Admitting: Psychology

## 2018-01-30 DIAGNOSIS — F4310 Post-traumatic stress disorder, unspecified: Secondary | ICD-10-CM | POA: Diagnosis not present

## 2018-02-25 IMAGING — CT CT MAXILLOFACIAL W/O CM
4 of 11 series · 15 of 47 positions shown, 17 images · non-contrast
Comparison: None.

CLINICAL DATA: Patient fell on ice skating drink presenting with
neck pain. Fell directly on face. Dried blood present about the left
eye.

EXAM:
CT HEAD WITHOUT CONTRAST
CT MAXILLOFACIAL WITHOUT CONTRAST
CT CERVICAL SPINE WITHOUT CONTRAST
TECHNIQUE: Multidetector CT imaging of the head, cervical spine, and
maxillofacial structures were performed using the standard protocol
without intravenous contrast. Multiplanar CT image reconstructions
of the cervical spine and maxillofacial structures were also
generated.

[Series 8: facialbone 2.0 st · axial · 0.35mm/px · z∈[-148,-20]mm · 5 of 98 slices shown]
[im 17/98  bone]
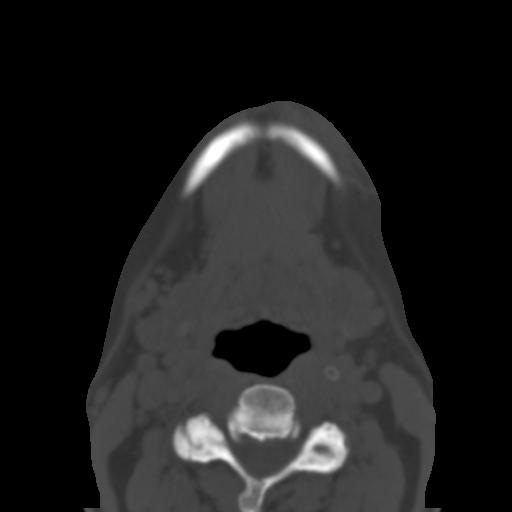
[im 33/98  bone]
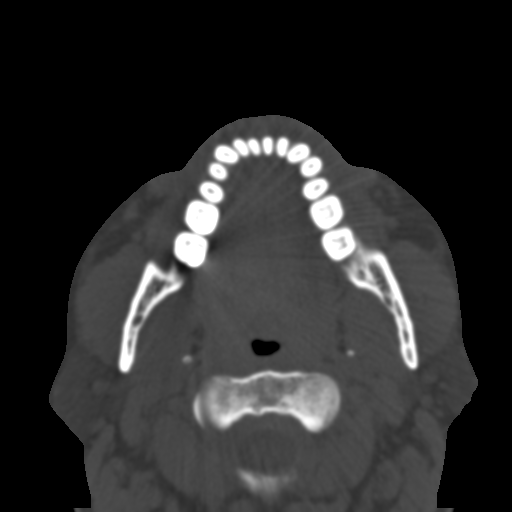
[im 49/98  bone]
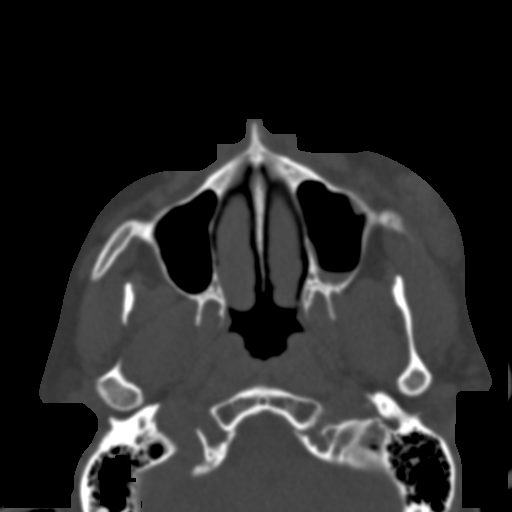
[im 65/98  bone]
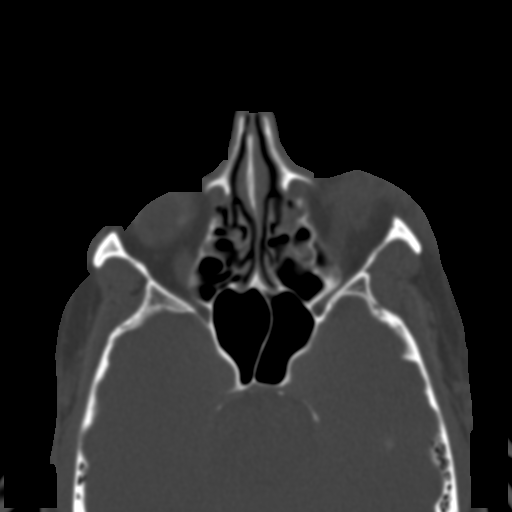
[im 81/98  bone]
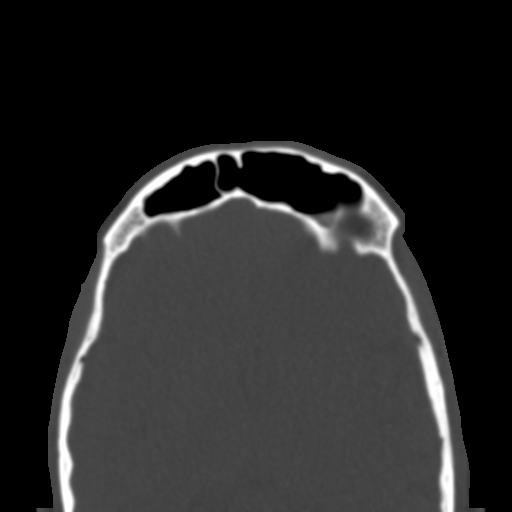

[Series 13: facialbone 2.0 sag st · sagittal · 0.34mm/px · 1 of 84 slices shown]
[im 42/84  bone]
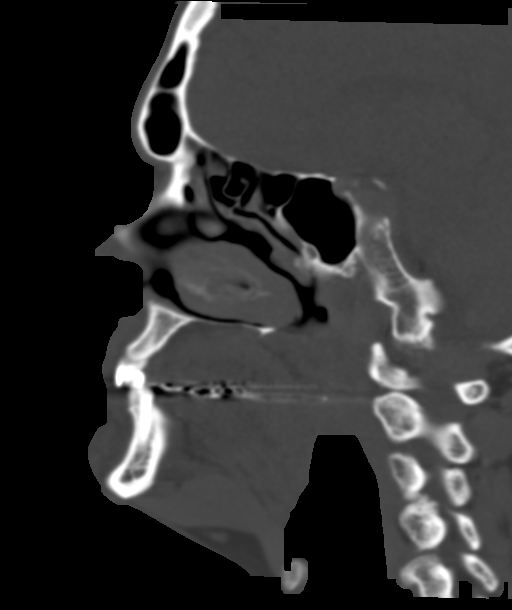

[Series 16: c_spine 2.0 st · axial · 0.38mm/px · z∈[-250,-92]mm · 6 of 111 slices shown, 8 images]
[im 16/111  brain]
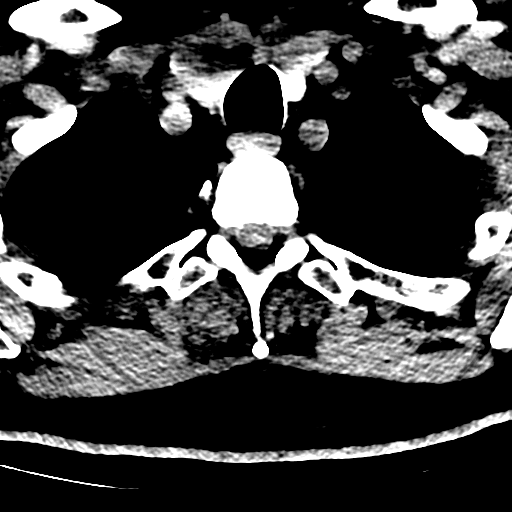
[im 16/111  bone]
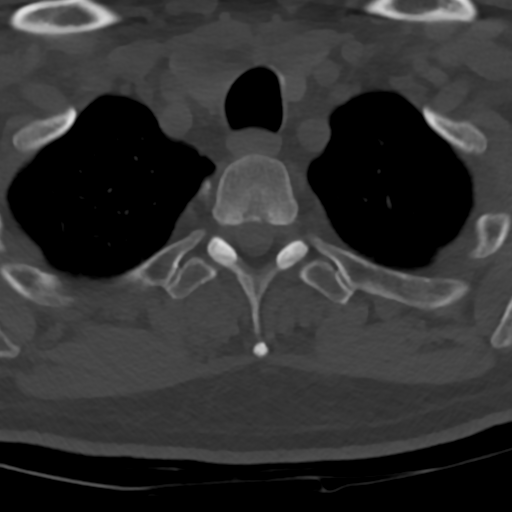
[im 32/111  bone]
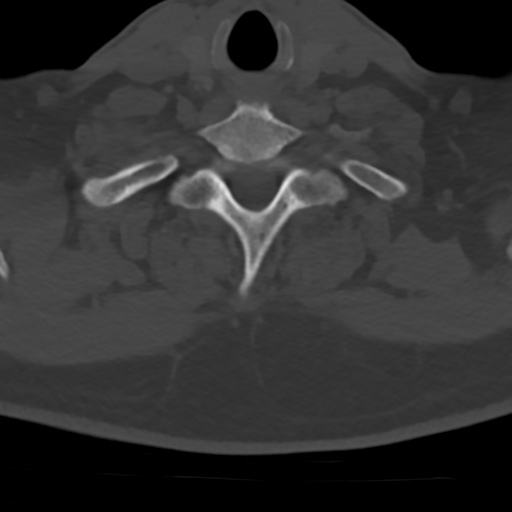
[im 48/111  bone]
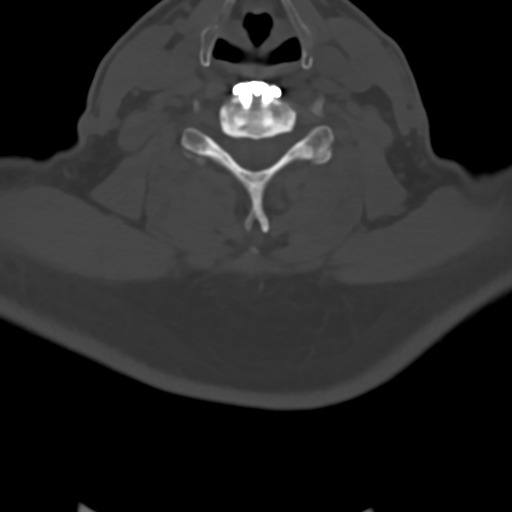
[im 63/111  bone]
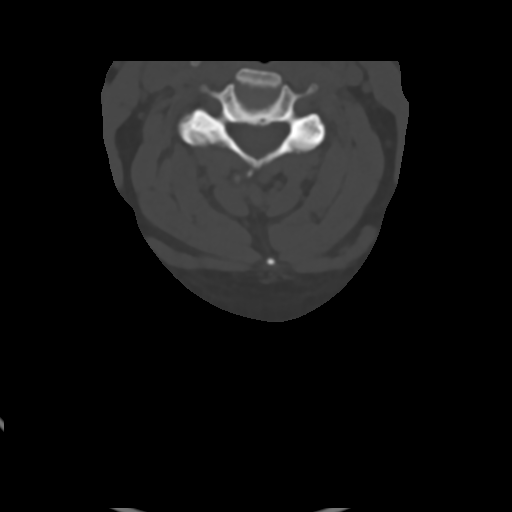
[im 79/111  brain]
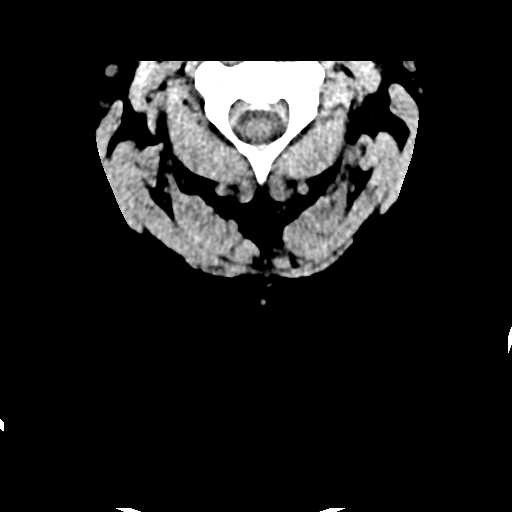
[im 79/111  bone]
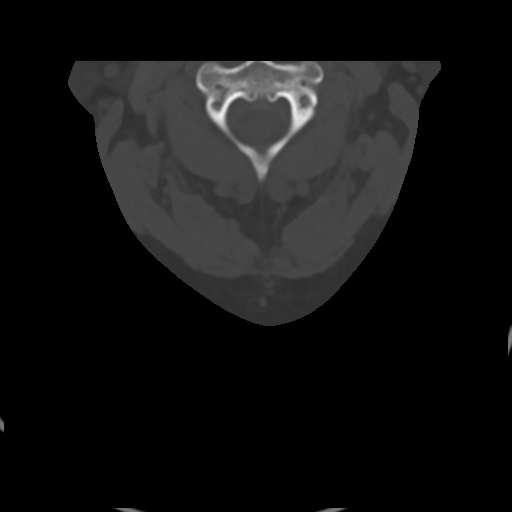
[im 95/111  bone]
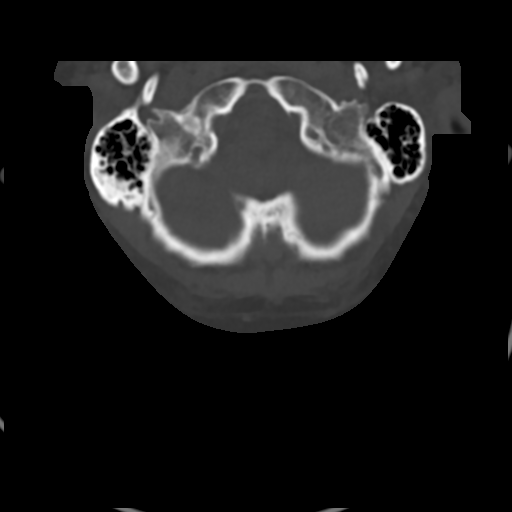

[Series 19: c_spine 2.0 cor bone · coronal · 0.32mm/px · 3 of 81 slices shown]
[im 21/81  bone]
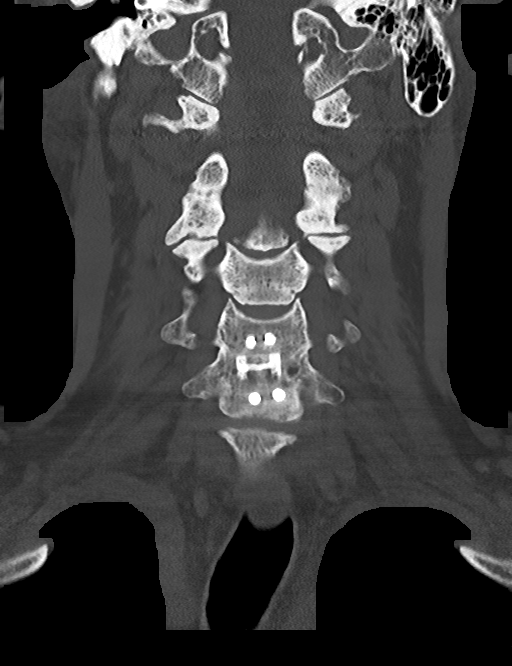
[im 41/81  bone]
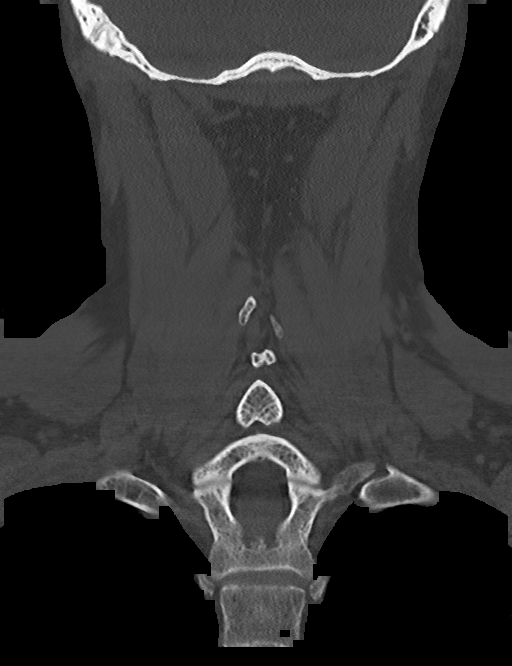
[im 61/81  bone]
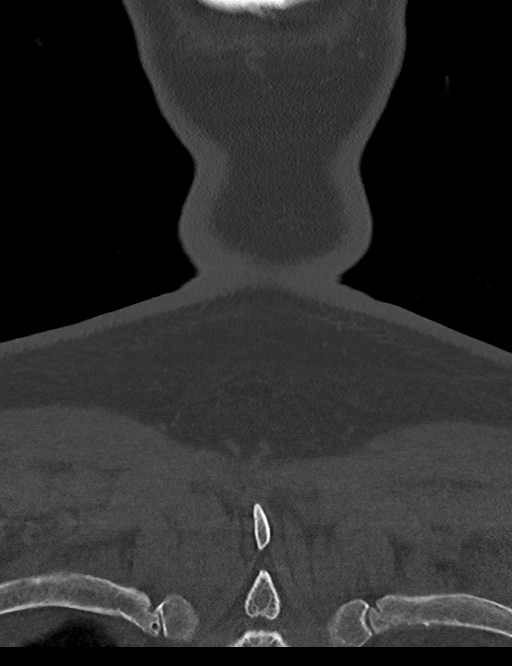

[15 of 47 positions shown; findings below may reference images not displayed]

FINDINGS: CT HEAD FINDINGS

Brain: No evidence of acute infarction, hemorrhage, hydrocephalus,
extra-axial collection or mass lesion/mass effect.

Vascular: No hyperdense vessel or unexpected calcification.

Skull: No skull fracture or focal osseous lesions.

Other: Left periorbital and premalar soft tissue swelling with small
air-fluid level in the left maxillary sinus.

CT MAXILLOFACIAL FINDINGS

Osseous: No fracture or mandibular dislocation. No destructive
process.

Orbits: Right-sided scleral band.  Intact orbits and globes.

Sinuses: Small air-fluid level in the left maxillary sinus. Mild
mucosal thickening along the anterior left ethmoid sinus. Clear
frontal, right maxillary and sphenoid sinuses.

Soft tissues: Left periorbital and premalar soft tissue contusion
with swelling.

CT CERVICAL SPINE FINDINGS

Alignment: Maintained cervical lordosis. Intact craniocervical
relationship and atlantodental interval.

Skull base and vertebrae: No acute fracture. No primary bone lesion
or focal pathologic process.

Soft tissues and spinal canal: No prevertebral fluid or swelling. No
visible canal hematoma.

Disc levels: Anterior cervical disc fusion at C5-6. Mild-to-moderate
disc space narrowing at C4-5 and C6-7. No significant neural
foraminal encroachment. No jumped or perched facets. Nuchal ligament
calcifications are noted.

Upper chest: Negative.

Other: None
IMPRESSION: 1. Left periorbital and premalar soft tissue swelling without
underlying facial or skull fracture.
2. No acute intracranial abnormality.
3. No acute cervical spine fracture or posttraumatic subluxations.
4. Mild left ethmoid and maxillary sinus disease.

## 2018-02-26 ENCOUNTER — Ambulatory Visit (INDEPENDENT_AMBULATORY_CARE_PROVIDER_SITE_OTHER): Payer: BLUE CROSS/BLUE SHIELD | Admitting: Psychology

## 2018-02-26 DIAGNOSIS — F4310 Post-traumatic stress disorder, unspecified: Secondary | ICD-10-CM | POA: Diagnosis not present

## 2018-03-27 ENCOUNTER — Ambulatory Visit (INDEPENDENT_AMBULATORY_CARE_PROVIDER_SITE_OTHER): Payer: BLUE CROSS/BLUE SHIELD | Admitting: Psychology

## 2018-03-27 DIAGNOSIS — F4310 Post-traumatic stress disorder, unspecified: Secondary | ICD-10-CM | POA: Diagnosis not present

## 2018-05-01 ENCOUNTER — Ambulatory Visit (INDEPENDENT_AMBULATORY_CARE_PROVIDER_SITE_OTHER): Payer: BLUE CROSS/BLUE SHIELD | Admitting: Psychology

## 2018-05-01 DIAGNOSIS — F4310 Post-traumatic stress disorder, unspecified: Secondary | ICD-10-CM

## 2018-05-15 ENCOUNTER — Ambulatory Visit (INDEPENDENT_AMBULATORY_CARE_PROVIDER_SITE_OTHER): Payer: BLUE CROSS/BLUE SHIELD | Admitting: Psychology

## 2018-05-15 DIAGNOSIS — F4310 Post-traumatic stress disorder, unspecified: Secondary | ICD-10-CM | POA: Diagnosis not present

## 2018-06-04 ENCOUNTER — Ambulatory Visit (INDEPENDENT_AMBULATORY_CARE_PROVIDER_SITE_OTHER): Payer: BLUE CROSS/BLUE SHIELD | Admitting: Psychology

## 2018-06-04 DIAGNOSIS — F4310 Post-traumatic stress disorder, unspecified: Secondary | ICD-10-CM | POA: Diagnosis not present

## 2018-06-26 ENCOUNTER — Ambulatory Visit (INDEPENDENT_AMBULATORY_CARE_PROVIDER_SITE_OTHER): Payer: BLUE CROSS/BLUE SHIELD | Admitting: Psychology

## 2018-06-26 DIAGNOSIS — F4310 Post-traumatic stress disorder, unspecified: Secondary | ICD-10-CM | POA: Diagnosis not present

## 2018-07-16 ENCOUNTER — Ambulatory Visit: Payer: BLUE CROSS/BLUE SHIELD | Admitting: Psychology

## 2018-09-17 ENCOUNTER — Ambulatory Visit (INDEPENDENT_AMBULATORY_CARE_PROVIDER_SITE_OTHER): Payer: BLUE CROSS/BLUE SHIELD | Admitting: Psychology

## 2018-09-17 DIAGNOSIS — F4310 Post-traumatic stress disorder, unspecified: Secondary | ICD-10-CM | POA: Diagnosis not present

## 2018-10-30 ENCOUNTER — Ambulatory Visit (INDEPENDENT_AMBULATORY_CARE_PROVIDER_SITE_OTHER): Payer: BLUE CROSS/BLUE SHIELD | Admitting: Psychology

## 2018-10-30 DIAGNOSIS — F4310 Post-traumatic stress disorder, unspecified: Secondary | ICD-10-CM | POA: Diagnosis not present

## 2018-12-25 ENCOUNTER — Ambulatory Visit (INDEPENDENT_AMBULATORY_CARE_PROVIDER_SITE_OTHER): Payer: BLUE CROSS/BLUE SHIELD | Admitting: Psychology

## 2018-12-25 DIAGNOSIS — F4310 Post-traumatic stress disorder, unspecified: Secondary | ICD-10-CM | POA: Diagnosis not present

## 2019-01-08 ENCOUNTER — Ambulatory Visit (INDEPENDENT_AMBULATORY_CARE_PROVIDER_SITE_OTHER): Payer: BLUE CROSS/BLUE SHIELD | Admitting: Psychology

## 2019-01-08 DIAGNOSIS — F4310 Post-traumatic stress disorder, unspecified: Secondary | ICD-10-CM | POA: Diagnosis not present

## 2019-02-12 ENCOUNTER — Ambulatory Visit: Payer: BLUE CROSS/BLUE SHIELD | Admitting: Psychology

## 2019-03-05 ENCOUNTER — Ambulatory Visit (INDEPENDENT_AMBULATORY_CARE_PROVIDER_SITE_OTHER): Payer: BLUE CROSS/BLUE SHIELD | Admitting: Psychology

## 2019-03-05 DIAGNOSIS — F4310 Post-traumatic stress disorder, unspecified: Secondary | ICD-10-CM | POA: Diagnosis not present

## 2019-04-03 ENCOUNTER — Ambulatory Visit (INDEPENDENT_AMBULATORY_CARE_PROVIDER_SITE_OTHER): Payer: BLUE CROSS/BLUE SHIELD | Admitting: Psychology

## 2019-04-03 DIAGNOSIS — F4310 Post-traumatic stress disorder, unspecified: Secondary | ICD-10-CM

## 2019-05-15 ENCOUNTER — Ambulatory Visit: Payer: BLUE CROSS/BLUE SHIELD | Admitting: Psychology

## 2019-06-10 ENCOUNTER — Ambulatory Visit: Payer: BLUE CROSS/BLUE SHIELD | Admitting: Psychology

## 2019-06-20 ENCOUNTER — Ambulatory Visit (INDEPENDENT_AMBULATORY_CARE_PROVIDER_SITE_OTHER): Payer: BLUE CROSS/BLUE SHIELD | Admitting: Psychology

## 2019-06-20 DIAGNOSIS — F4310 Post-traumatic stress disorder, unspecified: Secondary | ICD-10-CM | POA: Diagnosis not present

## 2019-08-15 ENCOUNTER — Ambulatory Visit (INDEPENDENT_AMBULATORY_CARE_PROVIDER_SITE_OTHER): Payer: BLUE CROSS/BLUE SHIELD | Admitting: Psychology

## 2019-08-15 DIAGNOSIS — F4310 Post-traumatic stress disorder, unspecified: Secondary | ICD-10-CM
